# Patient Record
Sex: Male | Born: 1935
Health system: Southern US, Community
[De-identification: ages and names within clinical notes are randomized; demographics above are authoritative.]

## PROBLEM LIST (undated history)

## (undated) DIAGNOSIS — M1990 Unspecified osteoarthritis, unspecified site: Secondary | ICD-10-CM

## (undated) DIAGNOSIS — I2510 Atherosclerotic heart disease of native coronary artery without angina pectoris: Secondary | ICD-10-CM

## (undated) DIAGNOSIS — N189 Chronic kidney disease, unspecified: Secondary | ICD-10-CM

## (undated) DIAGNOSIS — I714 Abdominal aortic aneurysm, without rupture, unspecified: Secondary | ICD-10-CM

## (undated) DIAGNOSIS — H547 Unspecified visual loss: Secondary | ICD-10-CM

## (undated) DIAGNOSIS — N2581 Secondary hyperparathyroidism of renal origin: Secondary | ICD-10-CM

## (undated) DIAGNOSIS — R0600 Dyspnea, unspecified: Secondary | ICD-10-CM

## (undated) DIAGNOSIS — N2589 Other disorders resulting from impaired renal tubular function: Secondary | ICD-10-CM

## (undated) DIAGNOSIS — I10 Essential (primary) hypertension: Secondary | ICD-10-CM

## (undated) DIAGNOSIS — J189 Pneumonia, unspecified organism: Secondary | ICD-10-CM

## (undated) DIAGNOSIS — E7800 Pure hypercholesterolemia, unspecified: Secondary | ICD-10-CM

## (undated) DIAGNOSIS — N184 Chronic kidney disease, stage 4 (severe): Secondary | ICD-10-CM

## (undated) DIAGNOSIS — I129 Hypertensive chronic kidney disease with stage 1 through stage 4 chronic kidney disease, or unspecified chronic kidney disease: Secondary | ICD-10-CM

## (undated) DIAGNOSIS — J309 Allergic rhinitis, unspecified: Secondary | ICD-10-CM

## (undated) DIAGNOSIS — J449 Chronic obstructive pulmonary disease, unspecified: Secondary | ICD-10-CM

## (undated) HISTORY — DX: Pure hypercholesterolemia, unspecified: E78.00

## (undated) HISTORY — DX: Unspecified visual loss: H54.7

## (undated) HISTORY — DX: Unspecified osteoarthritis, unspecified site: M19.90

## (undated) HISTORY — DX: Chronic kidney disease, unspecified: N18.9

## (undated) HISTORY — PX: TONSILLECTOMY: SUR1361

## (undated) HISTORY — PX: APPENDECTOMY: SHX54

## (undated) HISTORY — DX: Other disorders resulting from impaired renal tubular function: N25.89

## (undated) HISTORY — DX: Hypertensive chronic kidney disease with stage 1 through stage 4 chronic kidney disease, or unspecified chronic kidney disease: I12.9

## (undated) HISTORY — DX: Secondary hyperparathyroidism of renal origin: N25.81

## (undated) HISTORY — DX: Chronic kidney disease, stage 4 (severe): N18.4

## (undated) HISTORY — DX: Chronic obstructive pulmonary disease, unspecified: J44.9

## (undated) HISTORY — PX: MULTIPLE TOOTH EXTRACTIONS: SHX2053

## (undated) HISTORY — DX: Pneumonia, unspecified organism: J18.9

## (undated) HISTORY — DX: Allergic rhinitis, unspecified: J30.9

## (undated) HISTORY — PX: HIP FRACTURE SURGERY: SHX118

## (undated) HISTORY — DX: Atherosclerotic heart disease of native coronary artery without angina pectoris: I25.10

---

## 2011-02-12 DIAGNOSIS — N184 Chronic kidney disease, stage 4 (severe): Secondary | ICD-10-CM | POA: Diagnosis not present

## 2011-04-19 DIAGNOSIS — M109 Gout, unspecified: Secondary | ICD-10-CM | POA: Diagnosis not present

## 2011-04-19 DIAGNOSIS — N184 Chronic kidney disease, stage 4 (severe): Secondary | ICD-10-CM | POA: Diagnosis not present

## 2011-04-19 DIAGNOSIS — E213 Hyperparathyroidism, unspecified: Secondary | ICD-10-CM | POA: Diagnosis not present

## 2011-04-19 DIAGNOSIS — I129 Hypertensive chronic kidney disease with stage 1 through stage 4 chronic kidney disease, or unspecified chronic kidney disease: Secondary | ICD-10-CM | POA: Diagnosis not present

## 2011-04-19 DIAGNOSIS — N2581 Secondary hyperparathyroidism of renal origin: Secondary | ICD-10-CM | POA: Diagnosis not present

## 2011-04-19 DIAGNOSIS — D649 Anemia, unspecified: Secondary | ICD-10-CM | POA: Diagnosis not present

## 2011-04-26 DIAGNOSIS — N184 Chronic kidney disease, stage 4 (severe): Secondary | ICD-10-CM | POA: Diagnosis not present

## 2011-05-22 DIAGNOSIS — N184 Chronic kidney disease, stage 4 (severe): Secondary | ICD-10-CM | POA: Diagnosis not present

## 2011-06-21 DIAGNOSIS — N184 Chronic kidney disease, stage 4 (severe): Secondary | ICD-10-CM | POA: Diagnosis not present

## 2011-07-23 DIAGNOSIS — M109 Gout, unspecified: Secondary | ICD-10-CM | POA: Diagnosis not present

## 2011-07-23 DIAGNOSIS — D649 Anemia, unspecified: Secondary | ICD-10-CM | POA: Diagnosis not present

## 2011-07-23 DIAGNOSIS — N2581 Secondary hyperparathyroidism of renal origin: Secondary | ICD-10-CM | POA: Diagnosis not present

## 2011-07-23 DIAGNOSIS — I129 Hypertensive chronic kidney disease with stage 1 through stage 4 chronic kidney disease, or unspecified chronic kidney disease: Secondary | ICD-10-CM | POA: Diagnosis not present

## 2011-07-23 DIAGNOSIS — N184 Chronic kidney disease, stage 4 (severe): Secondary | ICD-10-CM | POA: Diagnosis not present

## 2011-07-23 DIAGNOSIS — E213 Hyperparathyroidism, unspecified: Secondary | ICD-10-CM | POA: Diagnosis not present

## 2011-08-29 DIAGNOSIS — N184 Chronic kidney disease, stage 4 (severe): Secondary | ICD-10-CM | POA: Diagnosis not present

## 2011-12-28 DIAGNOSIS — I10 Essential (primary) hypertension: Secondary | ICD-10-CM | POA: Diagnosis not present

## 2011-12-28 DIAGNOSIS — E7800 Pure hypercholesterolemia, unspecified: Secondary | ICD-10-CM | POA: Diagnosis not present

## 2011-12-28 DIAGNOSIS — J209 Acute bronchitis, unspecified: Secondary | ICD-10-CM | POA: Diagnosis not present

## 2011-12-28 DIAGNOSIS — Z6827 Body mass index (BMI) 27.0-27.9, adult: Secondary | ICD-10-CM | POA: Diagnosis not present

## 2012-01-11 DIAGNOSIS — J209 Acute bronchitis, unspecified: Secondary | ICD-10-CM | POA: Diagnosis not present

## 2012-01-11 DIAGNOSIS — Z6826 Body mass index (BMI) 26.0-26.9, adult: Secondary | ICD-10-CM | POA: Diagnosis not present

## 2012-01-11 DIAGNOSIS — I10 Essential (primary) hypertension: Secondary | ICD-10-CM | POA: Diagnosis not present

## 2012-01-11 DIAGNOSIS — E7800 Pure hypercholesterolemia, unspecified: Secondary | ICD-10-CM | POA: Diagnosis not present

## 2012-01-14 DIAGNOSIS — I10 Essential (primary) hypertension: Secondary | ICD-10-CM | POA: Diagnosis not present

## 2012-01-14 DIAGNOSIS — Z6826 Body mass index (BMI) 26.0-26.9, adult: Secondary | ICD-10-CM | POA: Diagnosis not present

## 2012-01-14 DIAGNOSIS — L259 Unspecified contact dermatitis, unspecified cause: Secondary | ICD-10-CM | POA: Diagnosis not present

## 2012-01-14 DIAGNOSIS — E7800 Pure hypercholesterolemia, unspecified: Secondary | ICD-10-CM | POA: Diagnosis not present

## 2012-01-15 DIAGNOSIS — M109 Gout, unspecified: Secondary | ICD-10-CM | POA: Diagnosis not present

## 2012-01-15 DIAGNOSIS — N184 Chronic kidney disease, stage 4 (severe): Secondary | ICD-10-CM | POA: Diagnosis not present

## 2012-01-15 DIAGNOSIS — I129 Hypertensive chronic kidney disease with stage 1 through stage 4 chronic kidney disease, or unspecified chronic kidney disease: Secondary | ICD-10-CM | POA: Diagnosis not present

## 2012-01-15 DIAGNOSIS — I10 Essential (primary) hypertension: Secondary | ICD-10-CM | POA: Diagnosis not present

## 2012-01-22 DIAGNOSIS — Z6827 Body mass index (BMI) 27.0-27.9, adult: Secondary | ICD-10-CM | POA: Diagnosis not present

## 2012-01-22 DIAGNOSIS — I10 Essential (primary) hypertension: Secondary | ICD-10-CM | POA: Diagnosis not present

## 2012-01-22 DIAGNOSIS — E7800 Pure hypercholesterolemia, unspecified: Secondary | ICD-10-CM | POA: Diagnosis not present

## 2012-01-22 DIAGNOSIS — L259 Unspecified contact dermatitis, unspecified cause: Secondary | ICD-10-CM | POA: Diagnosis not present

## 2012-02-04 DIAGNOSIS — N184 Chronic kidney disease, stage 4 (severe): Secondary | ICD-10-CM | POA: Diagnosis not present

## 2012-02-18 DIAGNOSIS — N184 Chronic kidney disease, stage 4 (severe): Secondary | ICD-10-CM | POA: Diagnosis not present

## 2012-02-18 DIAGNOSIS — E213 Hyperparathyroidism, unspecified: Secondary | ICD-10-CM | POA: Diagnosis not present

## 2012-04-15 DIAGNOSIS — I10 Essential (primary) hypertension: Secondary | ICD-10-CM | POA: Diagnosis not present

## 2012-04-15 DIAGNOSIS — I129 Hypertensive chronic kidney disease with stage 1 through stage 4 chronic kidney disease, or unspecified chronic kidney disease: Secondary | ICD-10-CM | POA: Diagnosis not present

## 2012-04-15 DIAGNOSIS — M109 Gout, unspecified: Secondary | ICD-10-CM | POA: Diagnosis not present

## 2012-04-15 DIAGNOSIS — N184 Chronic kidney disease, stage 4 (severe): Secondary | ICD-10-CM | POA: Diagnosis not present

## 2012-05-02 DIAGNOSIS — N184 Chronic kidney disease, stage 4 (severe): Secondary | ICD-10-CM | POA: Diagnosis not present

## 2012-07-28 DIAGNOSIS — D649 Anemia, unspecified: Secondary | ICD-10-CM | POA: Diagnosis not present

## 2012-07-28 DIAGNOSIS — N184 Chronic kidney disease, stage 4 (severe): Secondary | ICD-10-CM | POA: Diagnosis not present

## 2012-07-28 DIAGNOSIS — I129 Hypertensive chronic kidney disease with stage 1 through stage 4 chronic kidney disease, or unspecified chronic kidney disease: Secondary | ICD-10-CM | POA: Diagnosis not present

## 2012-07-28 DIAGNOSIS — N2581 Secondary hyperparathyroidism of renal origin: Secondary | ICD-10-CM | POA: Diagnosis not present

## 2012-07-28 DIAGNOSIS — E213 Hyperparathyroidism, unspecified: Secondary | ICD-10-CM | POA: Diagnosis not present

## 2012-07-28 DIAGNOSIS — I10 Essential (primary) hypertension: Secondary | ICD-10-CM | POA: Diagnosis not present

## 2012-11-03 DIAGNOSIS — D509 Iron deficiency anemia, unspecified: Secondary | ICD-10-CM | POA: Diagnosis not present

## 2012-11-03 DIAGNOSIS — N184 Chronic kidney disease, stage 4 (severe): Secondary | ICD-10-CM | POA: Diagnosis not present

## 2012-11-03 DIAGNOSIS — N2581 Secondary hyperparathyroidism of renal origin: Secondary | ICD-10-CM | POA: Diagnosis not present

## 2012-11-03 DIAGNOSIS — I129 Hypertensive chronic kidney disease with stage 1 through stage 4 chronic kidney disease, or unspecified chronic kidney disease: Secondary | ICD-10-CM | POA: Diagnosis not present

## 2012-11-03 DIAGNOSIS — E213 Hyperparathyroidism, unspecified: Secondary | ICD-10-CM | POA: Diagnosis not present

## 2012-11-03 DIAGNOSIS — S1190XA Unspecified open wound of unspecified part of neck, initial encounter: Secondary | ICD-10-CM | POA: Diagnosis not present

## 2013-01-22 DIAGNOSIS — J301 Allergic rhinitis due to pollen: Secondary | ICD-10-CM | POA: Diagnosis not present

## 2013-02-17 DIAGNOSIS — I10 Essential (primary) hypertension: Secondary | ICD-10-CM | POA: Diagnosis not present

## 2013-02-17 DIAGNOSIS — N184 Chronic kidney disease, stage 4 (severe): Secondary | ICD-10-CM | POA: Diagnosis not present

## 2013-02-17 DIAGNOSIS — E213 Hyperparathyroidism, unspecified: Secondary | ICD-10-CM | POA: Diagnosis not present

## 2013-02-17 DIAGNOSIS — D649 Anemia, unspecified: Secondary | ICD-10-CM | POA: Diagnosis not present

## 2013-05-18 DIAGNOSIS — N039 Chronic nephritic syndrome with unspecified morphologic changes: Secondary | ICD-10-CM | POA: Diagnosis not present

## 2013-05-18 DIAGNOSIS — N184 Chronic kidney disease, stage 4 (severe): Secondary | ICD-10-CM | POA: Diagnosis not present

## 2013-05-18 DIAGNOSIS — M109 Gout, unspecified: Secondary | ICD-10-CM | POA: Diagnosis not present

## 2013-05-18 DIAGNOSIS — I129 Hypertensive chronic kidney disease with stage 1 through stage 4 chronic kidney disease, or unspecified chronic kidney disease: Secondary | ICD-10-CM | POA: Diagnosis not present

## 2013-05-18 DIAGNOSIS — D631 Anemia in chronic kidney disease: Secondary | ICD-10-CM | POA: Diagnosis not present

## 2013-05-18 DIAGNOSIS — N2581 Secondary hyperparathyroidism of renal origin: Secondary | ICD-10-CM | POA: Diagnosis not present

## 2013-10-19 DIAGNOSIS — D631 Anemia in chronic kidney disease: Secondary | ICD-10-CM | POA: Diagnosis not present

## 2013-10-19 DIAGNOSIS — N039 Chronic nephritic syndrome with unspecified morphologic changes: Secondary | ICD-10-CM | POA: Diagnosis not present

## 2013-10-19 DIAGNOSIS — I129 Hypertensive chronic kidney disease with stage 1 through stage 4 chronic kidney disease, or unspecified chronic kidney disease: Secondary | ICD-10-CM | POA: Diagnosis not present

## 2013-10-19 DIAGNOSIS — N2581 Secondary hyperparathyroidism of renal origin: Secondary | ICD-10-CM | POA: Diagnosis not present

## 2013-10-19 DIAGNOSIS — N184 Chronic kidney disease, stage 4 (severe): Secondary | ICD-10-CM | POA: Diagnosis not present

## 2014-02-10 DIAGNOSIS — E780 Pure hypercholesterolemia: Secondary | ICD-10-CM | POA: Diagnosis not present

## 2014-02-10 DIAGNOSIS — I10 Essential (primary) hypertension: Secondary | ICD-10-CM | POA: Diagnosis not present

## 2014-02-10 DIAGNOSIS — Z6827 Body mass index (BMI) 27.0-27.9, adult: Secondary | ICD-10-CM | POA: Diagnosis not present

## 2014-02-10 DIAGNOSIS — J301 Allergic rhinitis due to pollen: Secondary | ICD-10-CM | POA: Diagnosis not present

## 2014-02-10 DIAGNOSIS — J189 Pneumonia, unspecified organism: Secondary | ICD-10-CM | POA: Diagnosis not present

## 2014-03-10 DIAGNOSIS — N184 Chronic kidney disease, stage 4 (severe): Secondary | ICD-10-CM | POA: Diagnosis not present

## 2014-03-10 DIAGNOSIS — N2581 Secondary hyperparathyroidism of renal origin: Secondary | ICD-10-CM | POA: Diagnosis not present

## 2014-03-10 DIAGNOSIS — D631 Anemia in chronic kidney disease: Secondary | ICD-10-CM | POA: Diagnosis not present

## 2014-03-10 DIAGNOSIS — I129 Hypertensive chronic kidney disease with stage 1 through stage 4 chronic kidney disease, or unspecified chronic kidney disease: Secondary | ICD-10-CM | POA: Diagnosis not present

## 2014-07-19 DIAGNOSIS — N2581 Secondary hyperparathyroidism of renal origin: Secondary | ICD-10-CM | POA: Diagnosis not present

## 2014-07-19 DIAGNOSIS — M109 Gout, unspecified: Secondary | ICD-10-CM | POA: Diagnosis not present

## 2014-07-19 DIAGNOSIS — D631 Anemia in chronic kidney disease: Secondary | ICD-10-CM | POA: Diagnosis not present

## 2014-07-19 DIAGNOSIS — I129 Hypertensive chronic kidney disease with stage 1 through stage 4 chronic kidney disease, or unspecified chronic kidney disease: Secondary | ICD-10-CM | POA: Diagnosis not present

## 2014-07-19 DIAGNOSIS — N184 Chronic kidney disease, stage 4 (severe): Secondary | ICD-10-CM | POA: Diagnosis not present

## 2014-10-19 DIAGNOSIS — D631 Anemia in chronic kidney disease: Secondary | ICD-10-CM | POA: Diagnosis not present

## 2014-10-19 DIAGNOSIS — I129 Hypertensive chronic kidney disease with stage 1 through stage 4 chronic kidney disease, or unspecified chronic kidney disease: Secondary | ICD-10-CM | POA: Diagnosis not present

## 2014-10-19 DIAGNOSIS — N2581 Secondary hyperparathyroidism of renal origin: Secondary | ICD-10-CM | POA: Diagnosis not present

## 2014-10-19 DIAGNOSIS — N184 Chronic kidney disease, stage 4 (severe): Secondary | ICD-10-CM | POA: Diagnosis not present

## 2014-11-10 DIAGNOSIS — N184 Chronic kidney disease, stage 4 (severe): Secondary | ICD-10-CM | POA: Diagnosis not present

## 2015-03-28 DIAGNOSIS — M109 Gout, unspecified: Secondary | ICD-10-CM | POA: Diagnosis not present

## 2015-03-28 DIAGNOSIS — I129 Hypertensive chronic kidney disease with stage 1 through stage 4 chronic kidney disease, or unspecified chronic kidney disease: Secondary | ICD-10-CM | POA: Diagnosis not present

## 2015-03-28 DIAGNOSIS — N2589 Other disorders resulting from impaired renal tubular function: Secondary | ICD-10-CM | POA: Diagnosis not present

## 2015-03-28 DIAGNOSIS — M1990 Unspecified osteoarthritis, unspecified site: Secondary | ICD-10-CM | POA: Diagnosis not present

## 2015-03-28 DIAGNOSIS — N2581 Secondary hyperparathyroidism of renal origin: Secondary | ICD-10-CM | POA: Diagnosis not present

## 2015-03-28 DIAGNOSIS — M5030 Other cervical disc degeneration, unspecified cervical region: Secondary | ICD-10-CM | POA: Diagnosis not present

## 2015-03-28 DIAGNOSIS — D631 Anemia in chronic kidney disease: Secondary | ICD-10-CM | POA: Diagnosis not present

## 2015-03-28 DIAGNOSIS — N184 Chronic kidney disease, stage 4 (severe): Secondary | ICD-10-CM | POA: Diagnosis not present

## 2015-03-28 DIAGNOSIS — Z0000 Encounter for general adult medical examination without abnormal findings: Secondary | ICD-10-CM | POA: Diagnosis not present

## 2015-03-28 DIAGNOSIS — I2510 Atherosclerotic heart disease of native coronary artery without angina pectoris: Secondary | ICD-10-CM | POA: Diagnosis not present

## 2015-05-10 DIAGNOSIS — N184 Chronic kidney disease, stage 4 (severe): Secondary | ICD-10-CM | POA: Diagnosis not present

## 2015-05-24 ENCOUNTER — Other Ambulatory Visit: Payer: Self-pay | Admitting: *Deleted

## 2015-05-24 DIAGNOSIS — N184 Chronic kidney disease, stage 4 (severe): Secondary | ICD-10-CM

## 2015-05-24 DIAGNOSIS — Z01810 Encounter for preprocedural cardiovascular examination: Secondary | ICD-10-CM

## 2015-05-25 ENCOUNTER — Encounter: Payer: Self-pay | Admitting: Vascular Surgery

## 2015-06-03 ENCOUNTER — Encounter: Payer: Self-pay | Admitting: Vascular Surgery

## 2015-06-10 ENCOUNTER — Ambulatory Visit (INDEPENDENT_AMBULATORY_CARE_PROVIDER_SITE_OTHER): Payer: Medicare Other | Admitting: Vascular Surgery

## 2015-06-10 ENCOUNTER — Encounter: Payer: Self-pay | Admitting: Vascular Surgery

## 2015-06-10 ENCOUNTER — Ambulatory Visit (HOSPITAL_COMMUNITY)
Admission: RE | Admit: 2015-06-10 | Discharge: 2015-06-10 | Disposition: A | Discharge status: Home / Self Care | Payer: Medicare Other | Source: Ambulatory Visit | Attending: Vascular Surgery | Admitting: Vascular Surgery

## 2015-06-10 ENCOUNTER — Ambulatory Visit (INDEPENDENT_AMBULATORY_CARE_PROVIDER_SITE_OTHER)
Admission: RE | Admit: 2015-06-10 | Discharge: 2015-06-10 | Disposition: A | Discharge status: Home / Self Care | Payer: Medicare Other | Source: Ambulatory Visit | Attending: Vascular Surgery | Admitting: Vascular Surgery

## 2015-06-10 VITALS — BP 117/82 | HR 72 | Temp 97.5°F | Resp 18 | Ht 71.0 in | Wt 166.0 lb

## 2015-06-10 DIAGNOSIS — N184 Chronic kidney disease, stage 4 (severe): Secondary | ICD-10-CM | POA: Diagnosis not present

## 2015-06-10 DIAGNOSIS — I129 Hypertensive chronic kidney disease with stage 1 through stage 4 chronic kidney disease, or unspecified chronic kidney disease: Secondary | ICD-10-CM | POA: Diagnosis not present

## 2015-06-10 DIAGNOSIS — Z01810 Encounter for preprocedural cardiovascular examination: Secondary | ICD-10-CM | POA: Diagnosis not present

## 2015-06-10 NOTE — Progress Notes (Signed)
Referred by:  [name], MD [address] [name], [place] [address]  Reason for referral: New access  History of Present Illness  Kenneth Veal Sr. is a 81 y.o. (1934-10-16) male who presents for evaluation for permanent access.  The patient is right hand dominant but uses both hands due prior right shoulder injuries.  The patient has not had previous access procedures.  Previous central venous cannulation procedures include: none.  The patient has never had a PPM placed.   Past Medical History  Diagnosis Date  . Secondary hyperparathyroidism (HCC)   . DJD (degenerative joint disease)   . Chronic kidney disease   . Chronic kidney disease, stage IV (severe) (HCC)   . Arteriosclerotic cardiovascular disease   . Hypertensive renal disease   . Renal tubular acidosis     Past Surgical History  Procedure Laterality Date  . Appendectomy    . Tonsillectomy    . Hip fracture surgery Left     Social History   Social History  . Marital Status: Married    Spouse Name: N/A  . Number of Children: N/A  . Years of Education: N/A   Occupational History  . Not on file.   Social History Main Topics  . Smoking status: Former Games developer  . Smokeless tobacco: Current User    Types: Chew  . Alcohol Use: No  . Drug Use: No  . Sexual Activity: Not on file   Other Topics Concern  . Not on file   Social History Narrative    Family History  Problem Relation Age of Onset  . Diabetes Mother   . Stroke Father   . AAA (abdominal aortic aneurysm) Father     Current Outpatient Prescriptions  Medication Sig Dispense Refill  . allopurinol (ZYLOPRIM) 300 MG tablet Take 300 mg by mouth daily.    Marland Kitchen amLODipine-valsartan (EXFORGE) 5-320 MG tablet Take 1 tablet by mouth at bedtime.    Marland Kitchen aspirin EC 81 MG tablet Take 81 mg by mouth daily.    . B Complex-C-Folic Acid (RENA-VITE RX) 1 MG TABS Take 1 mg by mouth daily.    . calcitRIOL (ROCALTROL) 0.25 MCG capsule Take 0.25 mcg by  mouth. Per medication list from [facility] patient is to take 1 (one) every third day.    . calcium acetate (PHOSLO) 667 MG capsule Take 667 mg by mouth 2 (two) times daily with a meal.    . FLUTICASONE PROPIONATE, NASAL, NA Place 2 sprays into the nose daily.    . furosemide (LASIX) 80 MG tablet Take 80 mg by mouth daily. Take 1/2  Oral daily per office notes from [facility]    . loratadine (CLARITIN) 10 MG tablet Take 10 mg by mouth at bedtime.    . metoprolol succinate (TOPROL-XL) 50 MG 24 hr tablet Take 50 mg by mouth at bedtime. Take with or immediately following a meal.    . sodium bicarbonate 650 MG tablet Take 1,300 mg by mouth 2 (two) times daily.     No current facility-administered medications for this visit.    Allergies  Allergen Reactions  . Prednisone Rash    REVIEW OF SYSTEMS:  (Positives checked otherwise negative)  CARDIOVASCULAR:   [ ]  chest pain,  [ ]  chest pressure,  [ ]  palpitations,  [ ]  shortness of breath when laying flat,  [ ]  shortness of breath with exertion,   [ ]  pain in feet when walking,  [ ]  pain  in feet when laying flat, [ ]  history of blood clot in veins (DVT),  [ ]  history of phlebitis,  [x]  swelling in legs,  [ ]  varicose veins  PULMONARY:   [ ]  productive cough,  [ ]  asthma,  [ ]  wheezing  NEUROLOGIC:   [ ]  weakness in arms or legs,  [ ]  numbness in arms or legs,  [ ]  difficulty speaking or slurred speech,  [ ]  temporary loss of vision in one eye,  [ ]  dizziness  HEMATOLOGIC:   [ ]  bleeding problems,  [ ]  problems with blood clotting too easily  MUSCULOSKEL:   [ ]  joint pain, [ ]  joint swelling  GASTROINTEST:   [ ]  vomiting blood,  [ ]  blood in stool     GENITOURINARY:   [ ]  burning with urination,  [ ]  blood in urine  PSYCHIATRIC:   [ ]  history of major depression  INTEGUMENTARY:   [ ]  rashes,  [ ]  ulcers  CONSTITUTIONAL:   [ ]  fever,  [ ]  chills   Physical Examination  Filed Vitals:   06/10/15  1006  BP: 117/82  Pulse: 72  Temp: 97.5 F (36.4 C)  Resp: 18  Height: 5\' 11"  (1.803 m)  Weight: 166 lb (75.297 kg)  SpO2: 97%   Body mass index is 23.16 kg/(m^2).  General: A&O x 3, WDWN  Head: [place]/AT  Ear/Nose/Throat: Hearing grossly intact, nares w/o erythema or drainage, oropharynx w/o Erythema/Exudate, Mallampati score: 3  Eyes: PERRLA, EOMI  Neck: Supple, no nuchal rigidity, no palpable LAD  Pulmonary: Sym exp, good air movt, CTAB, no rales, rhonchi, & wheezing  Cardiac: RRR, Nl S1, S2, no Murmurs, rubs or gallops  Vascular: Vessel Right Left  Radial Palpable Palpable  Ulnar Palpable Palpable  Brachial Palpable Palpable  Carotid Palpable, without bruit Palpable, without bruit  Aorta Not palpable N/A  Femoral Palpable Palpable  Popliteal Not palpable Not palpable  PT Faintly Palpable Faintly Palpable  DP Palpable Palpable   Gastrointestinal: soft, NTND, -G/R, - HSM, - masses, - CVAT B  Musculoskeletal: M/S 5/5 throughout , Extremities without ischemic changes   Neurologic: CN 2-12 intact , Pain and light touch intact in extremities , Motor exam as listed above  Psychiatric: Judgment intact, Mood & affect appropriate for pt's clinical situation  Dermatologic: See M/S exam for extremity exam, no rashes otherwise noted  Lymph : No Cervical, Axillary, or Inguinal lymphadenopathy    Non-Invasive Vascular Imaging  Vein Mapping  (Date: 06/10/2015):   R arm: acceptable vein conduits include upper arm cephalic, marginal forearm cephalic  L arm: acceptable vein conduits include marginal upper arm cephalic, ok forearm cephalic  BUE Doppler (Date: 06/10/2015):   R arm:   Brachial: tri, 4.5 mm  Radial: tri, 1.8 mm  Ulnar: tri, 1.8 mm  L arm:   Brachial: tri, 4.6 mm  Radial: tri, 1.6 mm  Ulnar: tri, 1.8 mm   Outside Studies/Documentation 10 pages of outside documents were reviewed including: outpatient nephrology chart.   Medical Decision  Making  Kenneth FriendlyDavid Vassar Sr. is a 81 y.o. male who presents with chronic kidney disease stage IV    Based on vein mapping and examination, this patient's permanent access options include: R BC AVF, possible L BC AVF  I had an extensive discussion with this patient in regards to the nature of access surgery, including risk, benefits, and alternatives.    The patient is aware that the risks of access surgery include but are  not limited to: bleeding, infection, steal syndrome, nerve damage, ischemic monomelic neuropathy, failure of access to mature, and possible need for additional access procedures in the future.  The patient has NOT agreed to proceed with the above procedure.  He is going to discuss his options with his nephrology.     [name], MD [facility] Office: [phone] Pager: [phone]  06/10/2015, 12:18 PM

## 2015-06-17 ENCOUNTER — Other Ambulatory Visit: Payer: Self-pay

## 2015-06-27 ENCOUNTER — Encounter (HOSPITAL_COMMUNITY): Payer: Self-pay | Admitting: *Deleted

## 2015-06-27 MED ORDER — DEXTROSE 5 % IV SOLN
1.5000 g | INTRAVENOUS | Status: AC
  Administered 2015-06-28: 1.5 g via INTRAVENOUS
  Filled 2015-06-27: qty 1.5

## 2015-06-27 MED ORDER — SODIUM CHLORIDE 0.9 % IV SOLN
INTRAVENOUS | Status: DC
  Administered 2015-06-28: 07:00:00 via INTRAVENOUS

## 2015-06-27 NOTE — Anesthesia Preprocedure Evaluation (Addendum)
Anesthesia Evaluation  Patient identified by MRN, date of birth, ID band Patient awake    Reviewed: Allergy & Precautions, NPO status , Patient's Chart, lab work & pertinent test results, reviewed documented beta blocker date and time   Airway Mallampati: III  TM Distance: >3 FB Neck ROM: Full    Dental  (+) Dental Advisory Given, Lower Dentures, Upper Dentures   Pulmonary former smoker,    Pulmonary exam normal breath sounds clear to auscultation       Cardiovascular hypertension, Pt. on medications and Pt. on home beta blockers (-) angina(-) Past MI Normal cardiovascular exam Rhythm:Regular Rate:Normal     Neuro/Psych negative neurological ROS  negative psych ROS   GI/Hepatic negative GI ROS, Neg liver ROS,   Endo/Other  Secondary hyperparathyroidism   Renal/GU Renal Insufficiency and Renal hypertensionRenal disease (Chronic kidney disease, stage IV (severe) )     Musculoskeletal  (+) Arthritis  (s/p right shoulder surgery), Osteoarthritis,    Abdominal   Peds  Hematology negative hematology ROS (+)   Anesthesia Other Findings Day of surgery medications reviewed with the patient.  Reproductive/Obstetrics                          Anesthesia Physical Anesthesia Plan  ASA: III  Anesthesia Plan: MAC   Post-op Pain Management:    Induction: Intravenous  Airway Management Planned: Nasal Cannula  Additional Equipment:   Intra-op Plan:   Post-operative Plan:   Informed Consent: I have reviewed the patients History and Physical, chart, labs and discussed the procedure including the risks, benefits and alternatives for the proposed anesthesia with the patient or authorized representative who has indicated his/her understanding and acceptance.   Dental advisory given  Plan Discussed with: CRNA and Anesthesiologist  Anesthesia Plan Comments: (Discussed risks/benefits/alternatives to MAC  sedation including need for ventilatory support, hypotension, need for conversion to general anesthesia.  All patient questions answered.  Patient/guardian wishes to proceed.)        Anesthesia Quick Evaluation

## 2015-06-27 NOTE — Progress Notes (Signed)
Pt denies SOB, chest pain, and being under the care of a cardiologist. Pt denies having a stress test, echo and cardiac cath. Pt denies having an EKG and chest x ray within the last year. Pt made aware to stop otc vitamins, fish oil, herbal medications and NSAID's. Pt verbalized understanding of all pre-op instructions.

## 2015-06-28 ENCOUNTER — Encounter (HOSPITAL_COMMUNITY)
Admission: RE | Disposition: A | Discharge status: Home / Self Care | Payer: Self-pay | Source: Ambulatory Visit | Attending: Vascular Surgery

## 2015-06-28 ENCOUNTER — Ambulatory Visit (HOSPITAL_COMMUNITY)
Admission: RE | Admit: 2015-06-28 | Discharge: 2015-06-28 | Disposition: A | Discharge status: Home / Self Care | Payer: Medicare Other | Source: Ambulatory Visit | Attending: Vascular Surgery | Admitting: Vascular Surgery

## 2015-06-28 ENCOUNTER — Ambulatory Visit (HOSPITAL_COMMUNITY): Payer: Medicare Other | Admitting: Vascular Surgery

## 2015-06-28 ENCOUNTER — Encounter (HOSPITAL_COMMUNITY): Payer: Self-pay | Admitting: Certified Registered Nurse Anesthetist

## 2015-06-28 DIAGNOSIS — I129 Hypertensive chronic kidney disease with stage 1 through stage 4 chronic kidney disease, or unspecified chronic kidney disease: Secondary | ICD-10-CM | POA: Insufficient documentation

## 2015-06-28 DIAGNOSIS — Z87891 Personal history of nicotine dependence: Secondary | ICD-10-CM | POA: Diagnosis not present

## 2015-06-28 DIAGNOSIS — N184 Chronic kidney disease, stage 4 (severe): Secondary | ICD-10-CM | POA: Diagnosis not present

## 2015-06-28 DIAGNOSIS — N2589 Other disorders resulting from impaired renal tubular function: Secondary | ICD-10-CM | POA: Diagnosis not present

## 2015-06-28 DIAGNOSIS — N2581 Secondary hyperparathyroidism of renal origin: Secondary | ICD-10-CM | POA: Insufficient documentation

## 2015-06-28 DIAGNOSIS — Z79899 Other long term (current) drug therapy: Secondary | ICD-10-CM | POA: Insufficient documentation

## 2015-06-28 DIAGNOSIS — Z7982 Long term (current) use of aspirin: Secondary | ICD-10-CM | POA: Diagnosis not present

## 2015-06-28 HISTORY — PX: AV FISTULA PLACEMENT: SHX1204

## 2015-06-28 HISTORY — DX: Essential (primary) hypertension: I10

## 2015-06-28 LAB — POCT I-STAT 4, (NA,K, GLUC, HGB,HCT)
Glucose, Bld: 95 mg/dL (ref 65–99)
HCT: 53 % — ABNORMAL HIGH (ref 39.0–52.0)
Hemoglobin: 18 g/dL — ABNORMAL HIGH (ref 13.0–17.0)
Potassium: 4 mmol/L (ref 3.5–5.1)
Sodium: 141 mmol/L (ref 135–145)

## 2015-06-28 SURGERY — ARTERIOVENOUS (AV) FISTULA CREATION
Anesthesia: Monitor Anesthesia Care | Site: Arm Lower | Laterality: Right

## 2015-06-28 MED ORDER — PROPOFOL 500 MG/50ML IV EMUL
INTRAVENOUS | Status: DC | PRN
  Administered 2015-06-28: 60 ug/kg/min via INTRAVENOUS

## 2015-06-28 MED ORDER — PHENYLEPHRINE HCL 10 MG/ML IJ SOLN
INTRAMUSCULAR | Status: DC | PRN
  Administered 2015-06-28: 120 ug via INTRAVENOUS
  Administered 2015-06-28: 160 ug via INTRAVENOUS
  Administered 2015-06-28 (×2): 120 ug via INTRAVENOUS

## 2015-06-28 MED ORDER — FENTANYL CITRATE (PF) 100 MCG/2ML IJ SOLN: 25.0000 ug | INTRAMUSCULAR | Status: DC | PRN

## 2015-06-28 MED ORDER — ONDANSETRON HCL 4 MG/2ML IJ SOLN: 4.0000 mg | Freq: Once | INTRAMUSCULAR | Status: DC | PRN

## 2015-06-28 MED ORDER — FENTANYL CITRATE (PF) 250 MCG/5ML IJ SOLN
INTRAMUSCULAR | Status: AC
  Filled 2015-06-28: qty 5

## 2015-06-28 MED ORDER — CHLORHEXIDINE GLUCONATE CLOTH 2 % EX PADS: 6.0000 | MEDICATED_PAD | Freq: Once | CUTANEOUS | Status: DC

## 2015-06-28 MED ORDER — PHENYLEPHRINE HCL 10 MG/ML IJ SOLN
10.0000 mg | INTRAVENOUS | Status: DC | PRN
  Administered 2015-06-28: 30 ug/min via INTRAVENOUS

## 2015-06-28 MED ORDER — MIDAZOLAM HCL 2 MG/2ML IJ SOLN
INTRAMUSCULAR | Status: AC
  Filled 2015-06-28: qty 2

## 2015-06-28 MED ORDER — EPHEDRINE SULFATE 50 MG/ML IJ SOLN
INTRAMUSCULAR | Status: DC | PRN
  Administered 2015-06-28: 5 mg via INTRAVENOUS

## 2015-06-28 MED ORDER — FENTANYL CITRATE (PF) 100 MCG/2ML IJ SOLN
INTRAMUSCULAR | Status: DC | PRN
  Administered 2015-06-28: 50 ug via INTRAVENOUS

## 2015-06-28 MED ORDER — ALBUMIN HUMAN 5 % IV SOLN
INTRAVENOUS | Status: DC | PRN
  Administered 2015-06-28: 08:00:00 via INTRAVENOUS

## 2015-06-28 MED ORDER — MIDAZOLAM HCL 5 MG/5ML IJ SOLN
INTRAMUSCULAR | Status: DC | PRN
  Administered 2015-06-28: 0.5 mg via INTRAVENOUS

## 2015-06-28 MED ORDER — SODIUM CHLORIDE 0.9 % IV SOLN
INTRAVENOUS | Status: DC | PRN
  Administered 2015-06-28: 500 mL

## 2015-06-28 MED ORDER — PROPOFOL 10 MG/ML IV BOLUS
INTRAVENOUS | Status: AC
  Filled 2015-06-28: qty 20

## 2015-06-28 MED ORDER — PHENYLEPHRINE 40 MCG/ML (10ML) SYRINGE FOR IV PUSH (FOR BLOOD PRESSURE SUPPORT)
PREFILLED_SYRINGE | INTRAVENOUS | Status: AC
  Filled 2015-06-28: qty 20

## 2015-06-28 MED ORDER — LIDOCAINE HCL (PF) 1 % IJ SOLN
INTRAMUSCULAR | Status: DC | PRN
  Administered 2015-06-28: 2 mL

## 2015-06-28 MED ORDER — EPHEDRINE 5 MG/ML INJ
INTRAVENOUS | Status: AC
  Filled 2015-06-28: qty 10

## 2015-06-28 MED ORDER — OXYCODONE-ACETAMINOPHEN 5-325 MG PO TABS: 1.0000 | ORAL_TABLET | Freq: Four times a day (QID) | ORAL | Status: DC | PRN

## 2015-06-28 MED ORDER — 0.9 % SODIUM CHLORIDE (POUR BTL) OPTIME
TOPICAL | Status: DC | PRN
  Administered 2015-06-28: 1000 mL

## 2015-06-28 MED ORDER — LIDOCAINE HCL (PF) 1 % IJ SOLN
INTRAMUSCULAR | Status: AC
  Filled 2015-06-28: qty 30

## 2015-06-28 SURGICAL SUPPLY — 34 items
ARMBAND PINK RESTRICT EXTREMIT (MISCELLANEOUS) ×3 IMPLANT
CANISTER SUCTION 2500CC (MISCELLANEOUS) ×3 IMPLANT
CLIP TI MEDIUM 6 (CLIP) ×3 IMPLANT
CLIP TI WIDE RED SMALL 6 (CLIP) ×3 IMPLANT
COVER PROBE W GEL 5X96 (DRAPES) ×3 IMPLANT
DECANTER SPIKE VIAL GLASS SM (MISCELLANEOUS) ×3 IMPLANT
ELECT REM PT RETURN 9FT ADLT (ELECTROSURGICAL) ×3
ELECTRODE REM PT RTRN 9FT ADLT (ELECTROSURGICAL) ×1 IMPLANT
GLOVE BIO SURGEON STRL SZ7 (GLOVE) ×3 IMPLANT
GLOVE BIOGEL PI IND STRL 6.5 (GLOVE) ×3 IMPLANT
GLOVE BIOGEL PI IND STRL 7.0 (GLOVE) ×1 IMPLANT
GLOVE BIOGEL PI IND STRL 7.5 (GLOVE) ×2 IMPLANT
GLOVE BIOGEL PI INDICATOR 6.5 (GLOVE) ×6
GLOVE BIOGEL PI INDICATOR 7.0 (GLOVE) ×2
GLOVE BIOGEL PI INDICATOR 7.5 (GLOVE) ×4
GLOVE ECLIPSE 6.5 STRL STRAW (GLOVE) ×3 IMPLANT
GLOVE SURG SS PI 6.5 STRL IVOR (GLOVE) ×3 IMPLANT
GLOVE SURG SS PI 7.0 STRL IVOR (GLOVE) ×3 IMPLANT
GOWN STRL REUS W/ TWL LRG LVL3 (GOWN DISPOSABLE) ×4 IMPLANT
GOWN STRL REUS W/TWL LRG LVL3 (GOWN DISPOSABLE) ×8
HEMOSTAT SPONGE AVITENE ULTRA (HEMOSTASIS) IMPLANT
KIT BASIN OR (CUSTOM PROCEDURE TRAY) ×3 IMPLANT
KIT ROOM TURNOVER OR (KITS) ×3 IMPLANT
LIQUID BAND (GAUZE/BANDAGES/DRESSINGS) ×3 IMPLANT
NS IRRIG 1000ML POUR BTL (IV SOLUTION) ×3 IMPLANT
PACK CV ACCESS (CUSTOM PROCEDURE TRAY) ×3 IMPLANT
PAD ARMBOARD 7.5X6 YLW CONV (MISCELLANEOUS) ×6 IMPLANT
SUT MNCRL AB 4-0 PS2 18 (SUTURE) ×3 IMPLANT
SUT PROLENE 6 0 BV (SUTURE) ×6 IMPLANT
SUT PROLENE 7 0 BV 1 (SUTURE) ×6 IMPLANT
SUT VIC AB 3-0 SH 27 (SUTURE) ×2
SUT VIC AB 3-0 SH 27X BRD (SUTURE) ×1 IMPLANT
UNDERPAD 30X30 INCONTINENT (UNDERPADS AND DIAPERS) ×3 IMPLANT
WATER STERILE IRR 1000ML POUR (IV SOLUTION) ×3 IMPLANT

## 2015-06-28 NOTE — Op Note (Signed)
OPERATIVE NOTE   PROCEDURE: right brachiocephalic arteriovenous fistula placement  PRE-OPERATIVE DIAGNOSIS: chronic kidney disease stage IV   POST-OPERATIVE DIAGNOSIS: same as above   SURGEON: Kenneth SakeBrian Mellissa Conley, MD  ASSISTANT(S): RNFA  ANESTHESIA: local and MAC  ESTIMATED BLOOD LOSS: 50 cc  FINDING(S): 1.  Palpable thrill at end of case 2.  Dopplerable right radial signal   SPECIMEN(S):  none  INDICATIONS:   Kenneth PyoDavid G Maggi Sr. is a 81 y.o. male who presents with chronic kidney disease stage IV.  The patient is scheduled for right brachiocephalic arteriovenous fistula placement.  The patient is aware the risks include but are not limited to: bleeding, infection, steal syndrome, nerve damage, ischemic monomelic neuropathy, failure to mature, and need for additional procedures.  The patient is aware of the risks of the procedure and elects to proceed forward.  DESCRIPTION: After full informed written consent was obtained from the patient, the patient was brought back to the operating room and placed supine upon the operating table.  Prior to induction, the patient received IV antibiotics.   After obtaining adequate anesthesia, the patient was then prepped and draped in the standard fashion for a right arm access procedure.  I turned my attention first to identifying the patient's cephalic vein and brachial artery.  Using SonoSite guidance, the location of these vessels were marked out on the skin.   At this point, I injected local anesthetic to obtain a field block of the antecubitum.  In total, I injected about 2 mL of 1% lidocaine without epinephrine.  I made a transverse incision at the level of the antecubitum and dissected through the subcutaneous tissue and fascia to gain exposure of the brachial artery.  This was noted to be 2.5-3.0 mm in diameter externally.  This was dissected out proximally and distally and controlled with vessel loops .  I then dissected out the cephalic vein.   This was noted to be 3 mm in diameter externally.  The distal segment of the vein was ligated with a  2-0 silk, and the vein was transected.  The proximal segment was interrogated with serial dilators.  The vein accepted up to a 3.5 mm dilator without any difficulty.  I then instilled the heparinized saline into the vein and clamped it.  At this point, I reset my exposure of the brachial artery and placed the artery under tension proximally and distally.  I made an arteriotomy with a #11 blade, and then I extended the arteriotomy with a Potts scissor.  I injected heparinized saline proximal and distal to this arteriotomy.  The vein was then sewn to the artery in an end-to-side configuration with a running stitch of 7-0 Prolene.  Prior to completing this anastomosis, I allowed the vein and artery to backbleed.  There was no evidence of clot from any vessels.  I completed the anastomosis in the usual fashion and then released all vessel loops and clamps.  There was a palpable thrill in the venous outflow, and there was a dopplerable radial signal.  At this point, I irrigated out the surgical wound.  There was no further active bleeding.  The subcutaneous tissue was reapproximated with a running stitch of 3-0 Vicryl.  The skin was then reapproximated with a running subcuticular stitch of 4-0 Vicryl.  The skin was then cleaned, dried, and reinforced with Dermabond.  The patient tolerated this procedure well.    COMPLICATIONS: none  CONDITION: stable   Kenneth SakeBrian Jadarian Mckay, MD [facility] Office: [phone] Pager: [phone]  06/28/2015, 8:36 AM

## 2015-06-28 NOTE — H&P (View-Only) (Signed)
Referred by:  [name], MD [address] [name], [place] [address]  Reason for referral: New access  History of Present Illness  Kenneth Veal Sr. is a 81 y.o. (1934-10-16) male who presents for evaluation for permanent access.  The patient is right hand dominant but uses both hands due prior right shoulder injuries.  The patient has not had previous access procedures.  Previous central venous cannulation procedures include: none.  The patient has never had a PPM placed.   Past Medical History  Diagnosis Date  . Secondary hyperparathyroidism (HCC)   . DJD (degenerative joint disease)   . Chronic kidney disease   . Chronic kidney disease, stage IV (severe) (HCC)   . Arteriosclerotic cardiovascular disease   . Hypertensive renal disease   . Renal tubular acidosis     Past Surgical History  Procedure Laterality Date  . Appendectomy    . Tonsillectomy    . Hip fracture surgery Left     Social History   Social History  . Marital Status: Married    Spouse Name: N/A  . Number of Children: N/A  . Years of Education: N/A   Occupational History  . Not on file.   Social History Main Topics  . Smoking status: Former Games developer  . Smokeless tobacco: Current User    Types: Chew  . Alcohol Use: No  . Drug Use: No  . Sexual Activity: Not on file   Other Topics Concern  . Not on file   Social History Narrative    Family History  Problem Relation Age of Onset  . Diabetes Mother   . Stroke Father   . AAA (abdominal aortic aneurysm) Father     Current Outpatient Prescriptions  Medication Sig Dispense Refill  . allopurinol (ZYLOPRIM) 300 MG tablet Take 300 mg by mouth daily.    Marland Kitchen amLODipine-valsartan (EXFORGE) 5-320 MG tablet Take 1 tablet by mouth at bedtime.    Marland Kitchen aspirin EC 81 MG tablet Take 81 mg by mouth daily.    . B Complex-C-Folic Acid (RENA-VITE RX) 1 MG TABS Take 1 mg by mouth daily.    . calcitRIOL (ROCALTROL) 0.25 MCG capsule Take 0.25 mcg by  mouth. Per medication list from [facility] patient is to take 1 (one) every third day.    . calcium acetate (PHOSLO) 667 MG capsule Take 667 mg by mouth 2 (two) times daily with a meal.    . FLUTICASONE PROPIONATE, NASAL, NA Place 2 sprays into the nose daily.    . furosemide (LASIX) 80 MG tablet Take 80 mg by mouth daily. Take 1/2  Oral daily per office notes from [facility]    . loratadine (CLARITIN) 10 MG tablet Take 10 mg by mouth at bedtime.    . metoprolol succinate (TOPROL-XL) 50 MG 24 hr tablet Take 50 mg by mouth at bedtime. Take with or immediately following a meal.    . sodium bicarbonate 650 MG tablet Take 1,300 mg by mouth 2 (two) times daily.     No current facility-administered medications for this visit.    Allergies  Allergen Reactions  . Prednisone Rash    REVIEW OF SYSTEMS:  (Positives checked otherwise negative)  CARDIOVASCULAR:   [ ]  chest pain,  [ ]  chest pressure,  [ ]  palpitations,  [ ]  shortness of breath when laying flat,  [ ]  shortness of breath with exertion,   [ ]  pain in feet when walking,  [ ]  pain  in feet when laying flat, [ ]  history of blood clot in veins (DVT),  [ ]  history of phlebitis,  [x]  swelling in legs,  [ ]  varicose veins  PULMONARY:   [ ]  productive cough,  [ ]  asthma,  [ ]  wheezing  NEUROLOGIC:   [ ]  weakness in arms or legs,  [ ]  numbness in arms or legs,  [ ]  difficulty speaking or slurred speech,  [ ]  temporary loss of vision in one eye,  [ ]  dizziness  HEMATOLOGIC:   [ ]  bleeding problems,  [ ]  problems with blood clotting too easily  MUSCULOSKEL:   [ ]  joint pain, [ ]  joint swelling  GASTROINTEST:   [ ]  vomiting blood,  [ ]  blood in stool     GENITOURINARY:   [ ]  burning with urination,  [ ]  blood in urine  PSYCHIATRIC:   [ ]  history of major depression  INTEGUMENTARY:   [ ]  rashes,  [ ]  ulcers  CONSTITUTIONAL:   [ ]  fever,  [ ]  chills   Physical Examination  Filed Vitals:   06/10/15  1006  BP: 117/82  Pulse: 72  Temp: 97.5 F (36.4 C)  Resp: 18  Height: 5\' 11"  (1.803 m)  Weight: 166 lb (75.297 kg)  SpO2: 97%   Body mass index is 23.16 kg/(m^2).  General: A&O x 3, WDWN  Head: [place]/AT  Ear/Nose/Throat: Hearing grossly intact, nares w/o erythema or drainage, oropharynx w/o Erythema/Exudate, Mallampati score: 3  Eyes: PERRLA, EOMI  Neck: Supple, no nuchal rigidity, no palpable LAD  Pulmonary: Sym exp, good air movt, CTAB, no rales, rhonchi, & wheezing  Cardiac: RRR, Nl S1, S2, no Murmurs, rubs or gallops  Vascular: Vessel Right Left  Radial Palpable Palpable  Ulnar Palpable Palpable  Brachial Palpable Palpable  Carotid Palpable, without bruit Palpable, without bruit  Aorta Not palpable N/A  Femoral Palpable Palpable  Popliteal Not palpable Not palpable  PT Faintly Palpable Faintly Palpable  DP Palpable Palpable   Gastrointestinal: soft, NTND, -G/R, - HSM, - masses, - CVAT B  Musculoskeletal: M/S 5/5 throughout , Extremities without ischemic changes   Neurologic: CN 2-12 intact , Pain and light touch intact in extremities , Motor exam as listed above  Psychiatric: Judgment intact, Mood & affect appropriate for pt's clinical situation  Dermatologic: See M/S exam for extremity exam, no rashes otherwise noted  Lymph : No Cervical, Axillary, or Inguinal lymphadenopathy    Non-Invasive Vascular Imaging  Vein Mapping  (Date: 06/10/2015):   R arm: acceptable vein conduits include upper arm cephalic, marginal forearm cephalic  L arm: acceptable vein conduits include marginal upper arm cephalic, ok forearm cephalic  BUE Doppler (Date: 06/10/2015):   R arm:   Brachial: tri, 4.5 mm  Radial: tri, 1.8 mm  Ulnar: tri, 1.8 mm  L arm:   Brachial: tri, 4.6 mm  Radial: tri, 1.6 mm  Ulnar: tri, 1.8 mm   Outside Studies/Documentation 10 pages of outside documents were reviewed including: outpatient nephrology chart.   Medical Decision  Making  Kenneth FriendlyDavid Vassar Sr. is a 81 y.o. male who presents with chronic kidney disease stage IV    Based on vein mapping and examination, this patient's permanent access options include: R BC AVF, possible L BC AVF  I had an extensive discussion with this patient in regards to the nature of access surgery, including risk, benefits, and alternatives.    The patient is aware that the risks of access surgery include but are  not limited to: bleeding, infection, steal syndrome, nerve damage, ischemic monomelic neuropathy, failure of access to mature, and possible need for additional access procedures in the future.  The patient has NOT agreed to proceed with the above procedure.  He is going to discuss his options with his nephrology.     [name], MD [facility] Office: [phone] Pager: [phone]  06/10/2015, 12:18 PM

## 2015-06-28 NOTE — Interval H&P Note (Signed)
History and Physical Interval Note:  06/28/2015 7:07 AM  Kenneth Pyoavid G Kaser Sr.  has presented today for surgery, with the diagnosis of Stage IV Chronic Kidney Disease N18.4  The various methods of treatment have been discussed with the patient and family. After consideration of risks, benefits and other options for treatment, the patient has consented to  Procedure(s): BRACHIOCEPHALIC ARTERIOVENOUS (AV) FISTULA CREATION (Right) as a surgical intervention .  The patient's history has been reviewed, patient examined, no change in status, stable for surgery.  I have reviewed the patient's chart and labs.  Questions were answered to the patient's satisfaction.     Leonides Sakehen, Riyansh Gerstner

## 2015-06-28 NOTE — Transfer of Care (Signed)
Immediate Anesthesia Transfer of Care Note  Patient: Kenneth PyoDavid G Lennartz Sr.  Procedure(s) Performed: Procedure(s): RIGHT BRACHIOCEPHALIC ARTERIOVENOUS (AV) FISTULA CREATION (Right)  Patient Location: PACU  Anesthesia Type:MAC  Level of Consciousness: awake, alert , oriented and patient cooperative  Airway & Oxygen Therapy: Patient Spontanous Breathing and Patient connected to face mask oxygen  Post-op Assessment: Report given to RN and Post -op Vital signs reviewed and stable  Post vital signs: Reviewed and stable  Last Vitals:  Filed Vitals:   06/28/15 0614  BP: 120/79  Pulse: 63  Temp: 36.5 C  Resp: 18    Last Pain: There were no vitals filed for this visit.    Patients Stated Pain Goal: 5 (06/28/15 [id number])  Complications: No apparent anesthesia complications

## 2015-06-29 ENCOUNTER — Telehealth: Payer: Self-pay | Admitting: Vascular Surgery

## 2015-06-29 ENCOUNTER — Encounter (HOSPITAL_COMMUNITY): Payer: Self-pay | Admitting: Vascular Surgery

## 2015-06-29 NOTE — Telephone Encounter (Signed)
Sched appt 7/14 at 8:30. Spoke to pt to inform them of appt.

## 2015-06-29 NOTE — Anesthesia Postprocedure Evaluation (Signed)
Anesthesia Post Note  Patient: Allene PyoDavid G Livolsi Sr.  Procedure(s) Performed: Procedure(s) (LRB): RIGHT BRACHIOCEPHALIC ARTERIOVENOUS (AV) FISTULA CREATION (Right)  Patient location during evaluation: PACU Anesthesia Type: MAC Level of consciousness: awake and alert, oriented, awake and patient cooperative Pain management: pain level controlled Vital Signs Assessment: post-procedure vital signs reviewed and stable Respiratory status: spontaneous breathing, nonlabored ventilation, respiratory function stable and patient connected to nasal cannula oxygen Cardiovascular status: blood pressure returned to baseline and stable Postop Assessment: no signs of nausea or vomiting Anesthetic complications: no    Last Vitals:  Filed Vitals:   06/28/15 0915 06/28/15 0933  BP: 103/74 119/71  Pulse: 77 63  Temp: 36.1 C   Resp: 14     Last Pain:  Filed Vitals:   06/28/15 0938  PainSc: 0-No pain                 Cecile HearingStephen Edward Turk

## 2015-06-29 NOTE — Telephone Encounter (Signed)
-----   Message from Sharee PimpleMarilyn K McChesney, RN sent at 06/28/2015 10:42 AM EDT ----- Regarding: schedule   ----- Message -----    From: Fransisco HertzBrian L Chen, MD    Sent: 06/28/2015   8:39 AM      [address] [address]  Allene PyoDavid G Mangiaracina Sr. [id number] 12/16/1934   PROCEDURE: right brachiocephalic arteriovenous fistula placement  Follow-up: 6 weeks

## 2015-07-01 ENCOUNTER — Telehealth: Payer: Self-pay

## 2015-07-01 NOTE — Telephone Encounter (Signed)
rec'd phone call from pt.  Reported he has "some redness from the bend of elbow down into the forearm, approx. 1/2 way to the wrist."  Stated there is "slight swelling."  Reported "the redness comes and goes."  Stated the incision is intact, without any redness or swelling.  Reported very small amt. of bloody drainage from one end of incision, yesterday, that "has dried up." Denied any further drainage. Denied fever/ chills.  Reported "some stinging in the incision."  Advised to continue to monitor the arm for signs of infection.  Encouraged to shower daily and wash incision with antibacterial soap.  Also encouraged to elevate the right arm above level of heart at intervals to keep swelling down.  Encouraged to call office if any concerns.  Verb. Understanding.

## 2015-07-25 DIAGNOSIS — N2589 Other disorders resulting from impaired renal tubular function: Secondary | ICD-10-CM | POA: Diagnosis not present

## 2015-07-25 DIAGNOSIS — I2510 Atherosclerotic heart disease of native coronary artery without angina pectoris: Secondary | ICD-10-CM | POA: Diagnosis not present

## 2015-07-25 DIAGNOSIS — I129 Hypertensive chronic kidney disease with stage 1 through stage 4 chronic kidney disease, or unspecified chronic kidney disease: Secondary | ICD-10-CM | POA: Diagnosis not present

## 2015-07-25 DIAGNOSIS — N184 Chronic kidney disease, stage 4 (severe): Secondary | ICD-10-CM | POA: Diagnosis not present

## 2015-07-25 DIAGNOSIS — E669 Obesity, unspecified: Secondary | ICD-10-CM | POA: Diagnosis not present

## 2015-07-25 DIAGNOSIS — M1990 Unspecified osteoarthritis, unspecified site: Secondary | ICD-10-CM | POA: Diagnosis not present

## 2015-07-25 DIAGNOSIS — I770 Arteriovenous fistula, acquired: Secondary | ICD-10-CM | POA: Diagnosis not present

## 2015-07-25 DIAGNOSIS — D631 Anemia in chronic kidney disease: Secondary | ICD-10-CM | POA: Diagnosis not present

## 2015-07-25 DIAGNOSIS — M109 Gout, unspecified: Secondary | ICD-10-CM | POA: Diagnosis not present

## 2015-07-25 DIAGNOSIS — J302 Other seasonal allergic rhinitis: Secondary | ICD-10-CM | POA: Diagnosis not present

## 2015-07-25 DIAGNOSIS — N2581 Secondary hyperparathyroidism of renal origin: Secondary | ICD-10-CM | POA: Diagnosis not present

## 2015-07-25 DIAGNOSIS — M5030 Other cervical disc degeneration, unspecified cervical region: Secondary | ICD-10-CM | POA: Diagnosis not present

## 2015-08-16 ENCOUNTER — Encounter: Payer: Self-pay | Admitting: Vascular Surgery

## 2015-08-19 ENCOUNTER — Encounter: Payer: Self-pay | Admitting: Vascular Surgery

## 2015-08-19 ENCOUNTER — Ambulatory Visit (INDEPENDENT_AMBULATORY_CARE_PROVIDER_SITE_OTHER): Payer: Medicare Other | Admitting: Vascular Surgery

## 2015-08-19 VITALS — BP 112/70 | HR 84 | Temp 98.5°F | Ht 71.0 in | Wt 170.1 lb

## 2015-08-19 DIAGNOSIS — N184 Chronic kidney disease, stage 4 (severe): Secondary | ICD-10-CM

## 2015-08-19 NOTE — Progress Notes (Signed)
    Postoperative Access Visit   History of Present Illness  Kenneth MinaDavid G Switala Sr. is a 81 y.o. year old male who presents for postoperative follow-up for: R BC AVF (Date: 06/28/15).  The patient's wounds are healed.  The patient notes no steal symptoms.  The patient is  able to complete their activities of daily living.  The patient's current symptoms are: hip fx related pain.  For VQI Use Only  PRE-ADM LIVING: Home  AMB STATUS: Ambulatory  Physical Examination Filed Vitals:   08/19/15 0829  BP: 112/70  Pulse: 84  Temp: 98.5 F (36.9 C)    RUE: Incision is healed, skin feels warm, hand grip is 5/5, sensation in digits is intact, palpable thrill, bruit can be auscultated, On Sonosite: fistula >= 6.0 mm  Medical Decision Making  Kenneth PyoDavid G Honeyman Sr. is a 81 y.o. year old male who presents s/p R BC AVF.   The patient's access is ready for use.  Thank you for allowing us to participate in this patient's care.  Leonides SakeBrian Chen, MD, FACS [facility] Office: [phone] Pager: [phone]

## 2015-10-24 DIAGNOSIS — J302 Other seasonal allergic rhinitis: Secondary | ICD-10-CM | POA: Diagnosis not present

## 2015-10-24 DIAGNOSIS — M5030 Other cervical disc degeneration, unspecified cervical region: Secondary | ICD-10-CM | POA: Diagnosis not present

## 2015-10-24 DIAGNOSIS — E669 Obesity, unspecified: Secondary | ICD-10-CM | POA: Diagnosis not present

## 2015-10-24 DIAGNOSIS — N2581 Secondary hyperparathyroidism of renal origin: Secondary | ICD-10-CM | POA: Diagnosis not present

## 2015-10-24 DIAGNOSIS — M109 Gout, unspecified: Secondary | ICD-10-CM | POA: Diagnosis not present

## 2015-10-24 DIAGNOSIS — N2589 Other disorders resulting from impaired renal tubular function: Secondary | ICD-10-CM | POA: Diagnosis not present

## 2015-10-24 DIAGNOSIS — I2510 Atherosclerotic heart disease of native coronary artery without angina pectoris: Secondary | ICD-10-CM | POA: Diagnosis not present

## 2015-10-24 DIAGNOSIS — D631 Anemia in chronic kidney disease: Secondary | ICD-10-CM | POA: Diagnosis not present

## 2015-10-24 DIAGNOSIS — I770 Arteriovenous fistula, acquired: Secondary | ICD-10-CM | POA: Diagnosis not present

## 2015-10-24 DIAGNOSIS — M1990 Unspecified osteoarthritis, unspecified site: Secondary | ICD-10-CM | POA: Diagnosis not present

## 2015-10-24 DIAGNOSIS — N184 Chronic kidney disease, stage 4 (severe): Secondary | ICD-10-CM | POA: Diagnosis not present

## 2015-10-24 DIAGNOSIS — I129 Hypertensive chronic kidney disease with stage 1 through stage 4 chronic kidney disease, or unspecified chronic kidney disease: Secondary | ICD-10-CM | POA: Diagnosis not present

## 2015-12-06 DIAGNOSIS — N2589 Other disorders resulting from impaired renal tubular function: Secondary | ICD-10-CM | POA: Diagnosis not present

## 2015-12-06 DIAGNOSIS — E669 Obesity, unspecified: Secondary | ICD-10-CM | POA: Diagnosis not present

## 2015-12-06 DIAGNOSIS — I2510 Atherosclerotic heart disease of native coronary artery without angina pectoris: Secondary | ICD-10-CM | POA: Diagnosis not present

## 2015-12-06 DIAGNOSIS — M1990 Unspecified osteoarthritis, unspecified site: Secondary | ICD-10-CM | POA: Diagnosis not present

## 2015-12-06 DIAGNOSIS — M5030 Other cervical disc degeneration, unspecified cervical region: Secondary | ICD-10-CM | POA: Diagnosis not present

## 2015-12-06 DIAGNOSIS — N184 Chronic kidney disease, stage 4 (severe): Secondary | ICD-10-CM | POA: Diagnosis not present

## 2015-12-06 DIAGNOSIS — I770 Arteriovenous fistula, acquired: Secondary | ICD-10-CM | POA: Diagnosis not present

## 2015-12-06 DIAGNOSIS — N2581 Secondary hyperparathyroidism of renal origin: Secondary | ICD-10-CM | POA: Diagnosis not present

## 2015-12-06 DIAGNOSIS — M109 Gout, unspecified: Secondary | ICD-10-CM | POA: Diagnosis not present

## 2015-12-06 DIAGNOSIS — D631 Anemia in chronic kidney disease: Secondary | ICD-10-CM | POA: Diagnosis not present

## 2015-12-06 DIAGNOSIS — I129 Hypertensive chronic kidney disease with stage 1 through stage 4 chronic kidney disease, or unspecified chronic kidney disease: Secondary | ICD-10-CM | POA: Diagnosis not present

## 2015-12-19 DIAGNOSIS — I10 Essential (primary) hypertension: Secondary | ICD-10-CM | POA: Diagnosis not present

## 2015-12-19 DIAGNOSIS — E663 Overweight: Secondary | ICD-10-CM | POA: Diagnosis not present

## 2015-12-19 DIAGNOSIS — J309 Allergic rhinitis, unspecified: Secondary | ICD-10-CM | POA: Diagnosis not present

## 2015-12-19 DIAGNOSIS — Z6825 Body mass index (BMI) 25.0-25.9, adult: Secondary | ICD-10-CM | POA: Diagnosis not present

## 2015-12-19 DIAGNOSIS — M5416 Radiculopathy, lumbar region: Secondary | ICD-10-CM | POA: Diagnosis not present

## 2015-12-19 DIAGNOSIS — Z1389 Encounter for screening for other disorder: Secondary | ICD-10-CM | POA: Diagnosis not present

## 2015-12-19 DIAGNOSIS — N189 Chronic kidney disease, unspecified: Secondary | ICD-10-CM | POA: Diagnosis not present

## 2015-12-19 DIAGNOSIS — R079 Chest pain, unspecified: Secondary | ICD-10-CM | POA: Diagnosis not present

## 2015-12-19 DIAGNOSIS — R0602 Shortness of breath: Secondary | ICD-10-CM | POA: Diagnosis not present

## 2015-12-19 DIAGNOSIS — Z9181 History of falling: Secondary | ICD-10-CM | POA: Diagnosis not present

## 2015-12-19 DIAGNOSIS — N184 Chronic kidney disease, stage 4 (severe): Secondary | ICD-10-CM | POA: Diagnosis not present

## 2015-12-19 DIAGNOSIS — E7800 Pure hypercholesterolemia, unspecified: Secondary | ICD-10-CM | POA: Diagnosis not present

## 2015-12-23 ENCOUNTER — Emergency Department (HOSPITAL_COMMUNITY): Payer: Medicare Other

## 2015-12-23 ENCOUNTER — Encounter (HOSPITAL_COMMUNITY): Payer: Self-pay

## 2015-12-23 ENCOUNTER — Inpatient Hospital Stay (HOSPITAL_COMMUNITY)
Admission: EM | Admit: 2015-12-23 | Discharge: 2015-12-29 | DRG: 286 | Disposition: A | Discharge status: Home Health | Payer: Medicare Other | Attending: Internal Medicine | Admitting: Internal Medicine

## 2015-12-23 DIAGNOSIS — J8410 Pulmonary fibrosis, unspecified: Secondary | ICD-10-CM

## 2015-12-23 DIAGNOSIS — R0600 Dyspnea, unspecified: Secondary | ICD-10-CM

## 2015-12-23 DIAGNOSIS — M5440 Lumbago with sciatica, unspecified side: Secondary | ICD-10-CM

## 2015-12-23 DIAGNOSIS — N2581 Secondary hyperparathyroidism of renal origin: Secondary | ICD-10-CM | POA: Diagnosis present

## 2015-12-23 DIAGNOSIS — R0609 Other forms of dyspnea: Secondary | ICD-10-CM

## 2015-12-23 DIAGNOSIS — J84112 Idiopathic pulmonary fibrosis: Secondary | ICD-10-CM | POA: Diagnosis present

## 2015-12-23 DIAGNOSIS — Z87891 Personal history of nicotine dependence: Secondary | ICD-10-CM

## 2015-12-23 DIAGNOSIS — I712 Thoracic aortic aneurysm, without rupture: Secondary | ICD-10-CM | POA: Diagnosis present

## 2015-12-23 DIAGNOSIS — Z7982 Long term (current) use of aspirin: Secondary | ICD-10-CM

## 2015-12-23 DIAGNOSIS — I713 Abdominal aortic aneurysm, ruptured: Secondary | ICD-10-CM | POA: Diagnosis not present

## 2015-12-23 DIAGNOSIS — X58XXXA Exposure to other specified factors, initial encounter: Secondary | ICD-10-CM | POA: Diagnosis present

## 2015-12-23 DIAGNOSIS — M6281 Muscle weakness (generalized): Secondary | ICD-10-CM

## 2015-12-23 DIAGNOSIS — G4733 Obstructive sleep apnea (adult) (pediatric): Secondary | ICD-10-CM | POA: Diagnosis present

## 2015-12-23 DIAGNOSIS — J9601 Acute respiratory failure with hypoxia: Secondary | ICD-10-CM | POA: Diagnosis present

## 2015-12-23 DIAGNOSIS — M5416 Radiculopathy, lumbar region: Secondary | ICD-10-CM | POA: Diagnosis not present

## 2015-12-23 DIAGNOSIS — I2729 Other secondary pulmonary hypertension: Secondary | ICD-10-CM | POA: Diagnosis not present

## 2015-12-23 DIAGNOSIS — I50810 Right heart failure, unspecified: Secondary | ICD-10-CM | POA: Diagnosis present

## 2015-12-23 DIAGNOSIS — I700 Atherosclerosis of aorta: Secondary | ICD-10-CM | POA: Diagnosis present

## 2015-12-23 DIAGNOSIS — M109 Gout, unspecified: Secondary | ICD-10-CM | POA: Diagnosis present

## 2015-12-23 DIAGNOSIS — M5126 Other intervertebral disc displacement, lumbar region: Secondary | ICD-10-CM | POA: Diagnosis not present

## 2015-12-23 DIAGNOSIS — M5441 Lumbago with sciatica, right side: Secondary | ICD-10-CM | POA: Diagnosis present

## 2015-12-23 DIAGNOSIS — I2510 Atherosclerotic heart disease of native coronary artery without angina pectoris: Secondary | ICD-10-CM | POA: Diagnosis present

## 2015-12-23 DIAGNOSIS — M549 Dorsalgia, unspecified: Secondary | ICD-10-CM

## 2015-12-23 DIAGNOSIS — I714 Abdominal aortic aneurysm, without rupture, unspecified: Secondary | ICD-10-CM

## 2015-12-23 DIAGNOSIS — R531 Weakness: Secondary | ICD-10-CM | POA: Diagnosis present

## 2015-12-23 DIAGNOSIS — K5730 Diverticulosis of large intestine without perforation or abscess without bleeding: Secondary | ICD-10-CM | POA: Diagnosis present

## 2015-12-23 DIAGNOSIS — Z79899 Other long term (current) drug therapy: Secondary | ICD-10-CM

## 2015-12-23 DIAGNOSIS — K551 Chronic vascular disorders of intestine: Secondary | ICD-10-CM | POA: Diagnosis present

## 2015-12-23 DIAGNOSIS — I10 Essential (primary) hypertension: Secondary | ICD-10-CM

## 2015-12-23 DIAGNOSIS — M1990 Unspecified osteoarthritis, unspecified site: Secondary | ICD-10-CM | POA: Diagnosis present

## 2015-12-23 DIAGNOSIS — N184 Chronic kidney disease, stage 4 (severe): Secondary | ICD-10-CM

## 2015-12-23 DIAGNOSIS — I129 Hypertensive chronic kidney disease with stage 1 through stage 4 chronic kidney disease, or unspecified chronic kidney disease: Secondary | ICD-10-CM | POA: Diagnosis not present

## 2015-12-23 DIAGNOSIS — R262 Difficulty in walking, not elsewhere classified: Secondary | ICD-10-CM | POA: Diagnosis present

## 2015-12-23 DIAGNOSIS — I130 Hypertensive heart and chronic kidney disease with heart failure and stage 1 through stage 4 chronic kidney disease, or unspecified chronic kidney disease: Secondary | ICD-10-CM | POA: Diagnosis present

## 2015-12-23 DIAGNOSIS — F17210 Nicotine dependence, cigarettes, uncomplicated: Secondary | ICD-10-CM | POA: Diagnosis present

## 2015-12-23 DIAGNOSIS — M545 Low back pain: Secondary | ICD-10-CM | POA: Diagnosis not present

## 2015-12-23 DIAGNOSIS — I272 Pulmonary hypertension, unspecified: Secondary | ICD-10-CM

## 2015-12-23 DIAGNOSIS — R29898 Other symptoms and signs involving the musculoskeletal system: Secondary | ICD-10-CM | POA: Diagnosis not present

## 2015-12-23 DIAGNOSIS — Z79891 Long term (current) use of opiate analgesic: Secondary | ICD-10-CM

## 2015-12-23 DIAGNOSIS — M4854XA Collapsed vertebra, not elsewhere classified, thoracic region, initial encounter for fracture: Secondary | ICD-10-CM | POA: Diagnosis present

## 2015-12-23 DIAGNOSIS — E785 Hyperlipidemia, unspecified: Secondary | ICD-10-CM | POA: Diagnosis present

## 2015-12-23 DIAGNOSIS — Z888 Allergy status to other drugs, medicaments and biological substances status: Secondary | ICD-10-CM

## 2015-12-23 LAB — CBC WITH DIFFERENTIAL/PLATELET
BASOS ABS: 0.1 10*3/uL (ref 0.0–0.1)
BASOS PCT: 1 %
Eosinophils Absolute: 0.3 10*3/uL (ref 0.0–0.7)
Eosinophils Relative: 3 %
HEMATOCRIT: 54.8 % — AB (ref 39.0–52.0)
HEMOGLOBIN: 18.8 g/dL — AB (ref 13.0–17.0)
LYMPHS PCT: 17 %
Lymphs Abs: 1.9 10*3/uL (ref 0.7–4.0)
MCH: 33.8 pg (ref 26.0–34.0)
MCHC: 34.3 g/dL (ref 30.0–36.0)
MCV: 98.4 fL (ref 78.0–100.0)
Monocytes Absolute: 0.8 10*3/uL (ref 0.1–1.0)
Monocytes Relative: 8 %
NEUTROS ABS: 7.6 10*3/uL (ref 1.7–7.7)
NEUTROS PCT: 71 %
Platelets: 161 10*3/uL (ref 150–400)
RBC: 5.57 MIL/uL (ref 4.22–5.81)
RDW: 15.2 % (ref 11.5–15.5)
WBC: 10.6 10*3/uL — ABNORMAL HIGH (ref 4.0–10.5)

## 2015-12-23 LAB — COMPREHENSIVE METABOLIC PANEL
ALBUMIN: 3.7 g/dL (ref 3.5–5.0)
ALT: 9 U/L — ABNORMAL LOW (ref 17–63)
ANION GAP: 6 (ref 5–15)
AST: 22 U/L (ref 15–41)
Alkaline Phosphatase: 59 U/L (ref 38–126)
BUN: 25 mg/dL — ABNORMAL HIGH (ref 6–20)
CALCIUM: 9.6 mg/dL (ref 8.9–10.3)
CHLORIDE: 112 mmol/L — AB (ref 101–111)
CO2: 20 mmol/L — AB (ref 22–32)
Creatinine, Ser: 2.09 mg/dL — ABNORMAL HIGH (ref 0.61–1.24)
GFR calc non Af Amer: 28 mL/min — ABNORMAL LOW (ref >60)
GFR, EST AFRICAN AMERICAN: 33 mL/min — AB (ref >60)
GLUCOSE: 92 mg/dL (ref 65–99)
POTASSIUM: 4.2 mmol/L (ref 3.5–5.1)
SODIUM: 138 mmol/L (ref 135–145)
Total Bilirubin: 0.8 mg/dL (ref 0.3–1.2)
Total Protein: 6.2 g/dL — ABNORMAL LOW (ref 6.5–8.1)

## 2015-12-23 LAB — PROTIME-INR
INR: 1.03
Prothrombin Time: 13.6 seconds (ref 11.4–15.2)

## 2015-12-23 LAB — MAGNESIUM: Magnesium: 2.2 mg/dL (ref 1.7–2.4)

## 2015-12-23 LAB — TYPE AND SCREEN
ABO/RH(D): A POS
ANTIBODY SCREEN: NEGATIVE

## 2015-12-23 LAB — ABO/RH: ABO/RH(D): A POS

## 2015-12-23 IMAGING — CT CT ANGIO CHEST-ABD-PELV FOR DISSECTION W/ AND WO/W CM
1 of 2 series · 2 of 30 positions shown, 3 images · IV contrast (Iohexol (Omnipaque 350))
Comparison: MRI from 12/23/2015, 08/29/2007 ultrasound in

CLINICAL DATA: Abdominal aortic aneurysm with worsening back pain.

EXAM:
CT ANGIOGRAPHY CHEST, ABDOMEN AND PELVIS
TECHNIQUE: Multidetector CT imaging through the chest, abdomen and pelvis was
performed using the standard protocol during bolus administration of
intravenous contrast. Multiplanar reconstructed images and MIPs were
obtained and reviewed to evaluate the vascular anatomy.
CONTRAST:  100 cc of Isovue 370 IV

[Series 502: coronals, idose (1) · coronal · 0.45mm/px · 2 of 178 slices shown, 3 images]
[im 55/178  mediastinal]
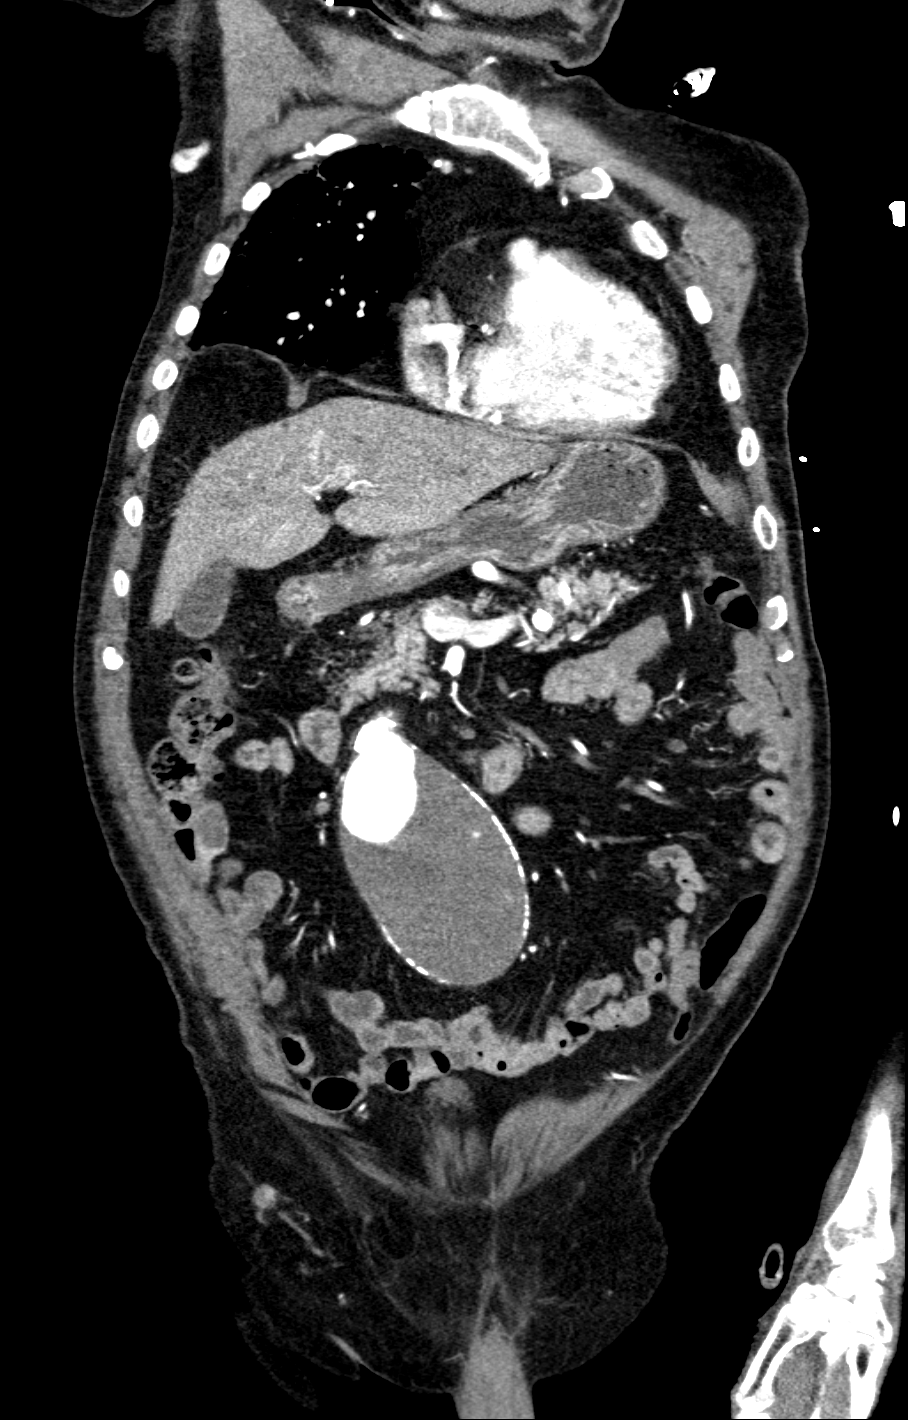
[im 55/178  bone]
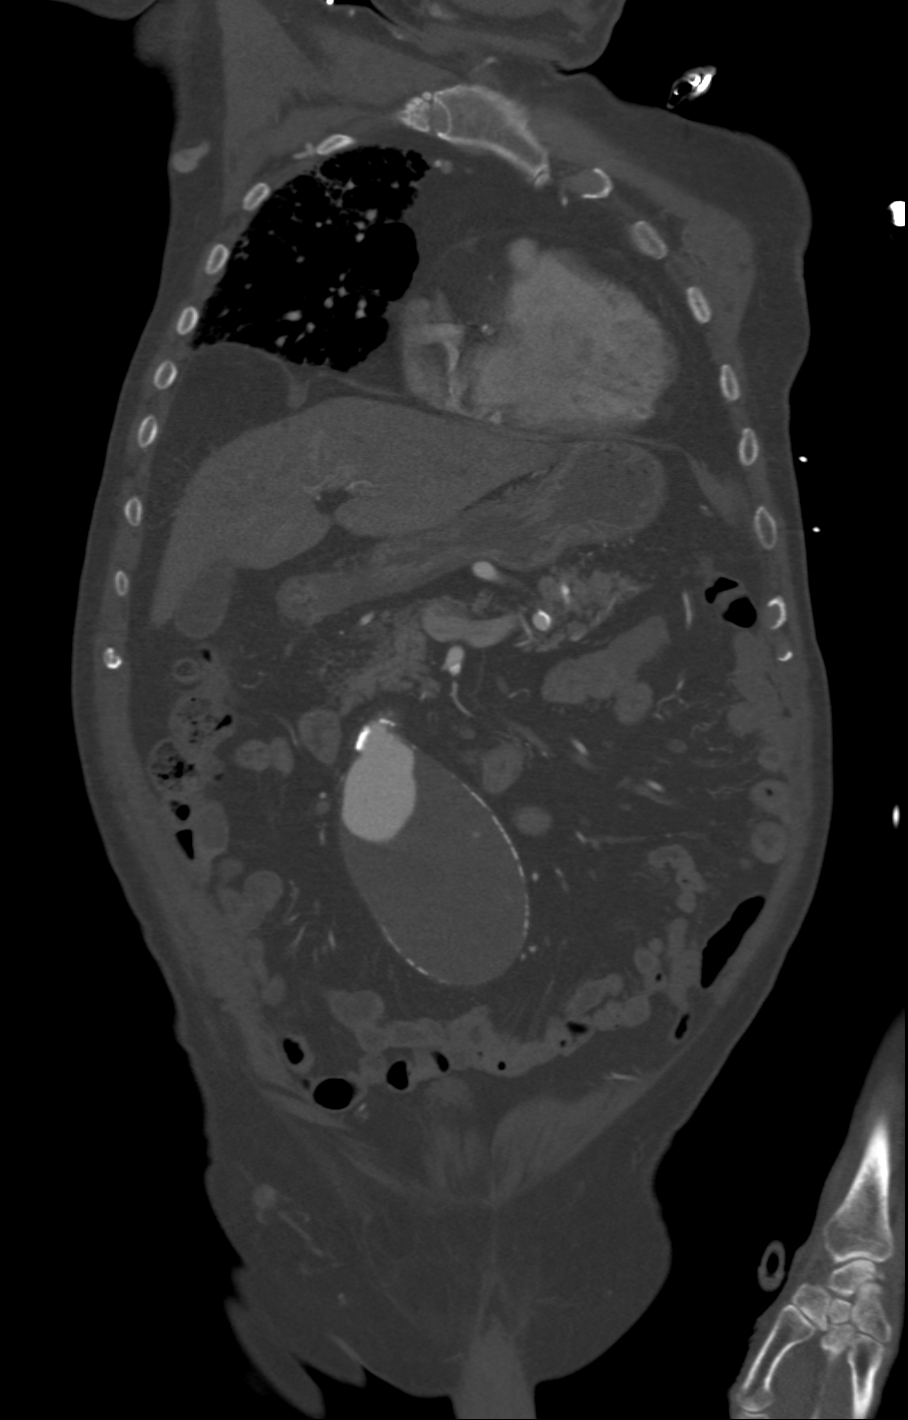
[im 123/178  mediastinal]
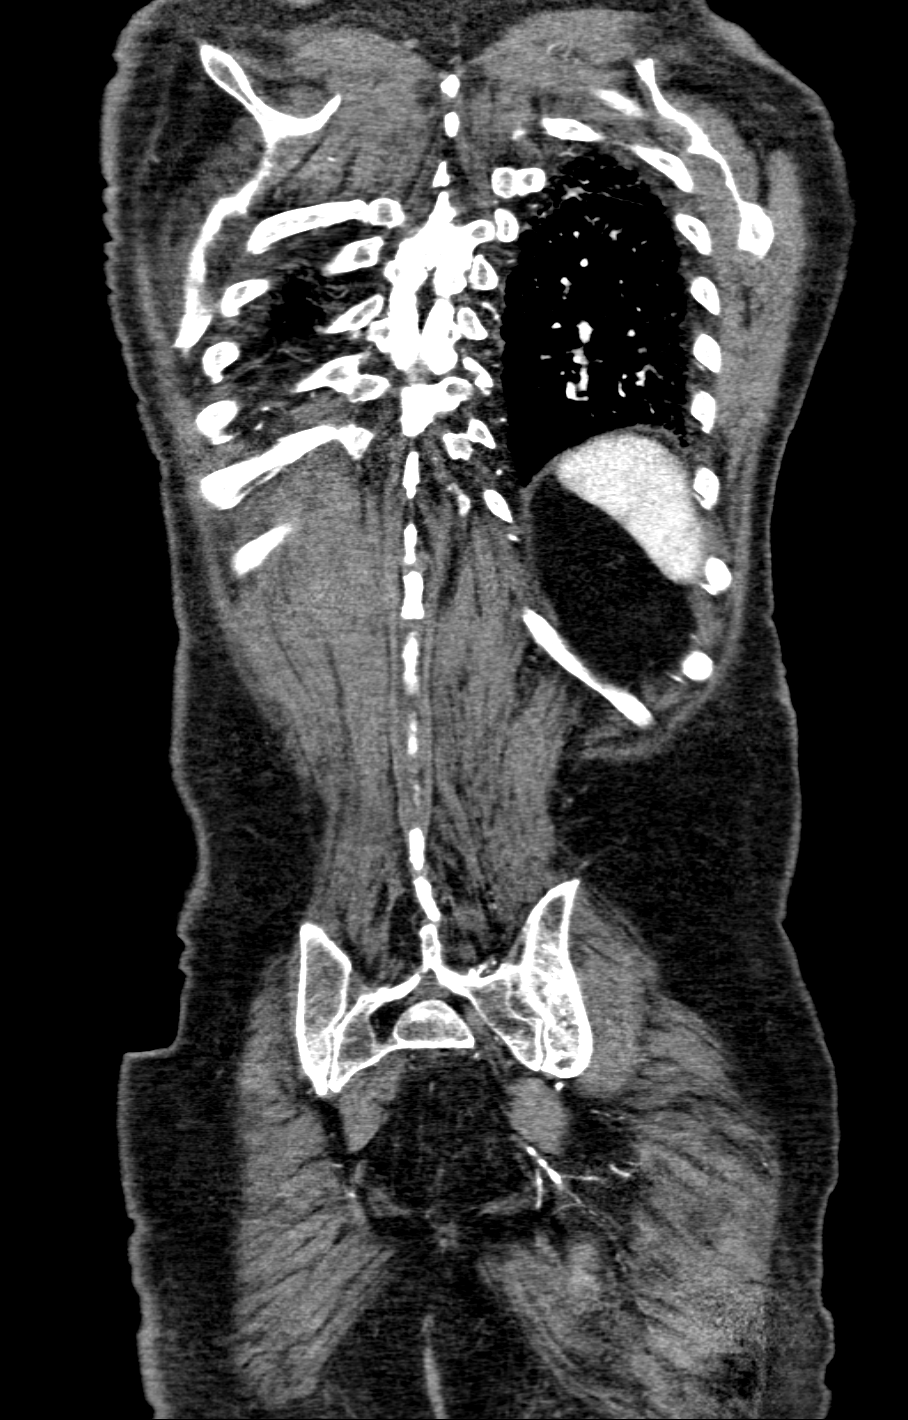

[2 of 30 positions shown; findings below may reference images not displayed]

FINDINGS: CTA CHEST FINDINGS

Cardiovascular: Satisfactory opacification of the pulmonary arteries
to the segmental level. No evidence of pulmonary embolism. The heart
is enlarged. No pericardial effusion. There is three-vessel coronary
arteriosclerosis. No thoracic aortic aneurysm nor dissection.

Mediastinum/Nodes: No enlarged mediastinal, hilar, or axillary lymph
nodes. Thyroid gland, trachea, and esophagus demonstrate no
significant findings.

Lungs/Pleura: Bilateral paraseptal and centrilobular emphysema with
superimposed ground-glass opacities which may represent sequela mild
pulmonary edema.

Musculoskeletal: Anterior compression of T7 which appears chronic
without evidence of paraspinal hematoma or retropulsion.
Degenerative disc disease from T1 through T5 and T7 through T11.

Review of the MIP images confirms the above findings.

CTA ABDOMEN AND PELVIS FINDINGS

VASCULAR

Aorta: There is a fusiform infrarenal abdominal aortic aneurysm
measuring up to 8.1 cm transverse. The proximal neck measures 2.2 cm
in caliber and is approximately 3.5 cm from the left renal artery
origin. The distal aspect of the fusiform aortic aneurysm extends to
the aortic bifurcation were there is an ectatic appearance of both
common iliac arteries, on the right measuring up to 1.9 cm and on
the left, appearing more aneurysmal measuring 2.9 cm with mild
circumferential soft plaque. The external iliac artery on the right
measures 0.6 cm and on the left 0.8 cm and are patent.

No evidence of abdominal aortic leak. No retroperitoneal fluid is
seen. Along the left lateral soft plaque are developing
calcifications which account for the hyperdensity seen. No
communication with intraluminal contrast to suggested an ulcerated
plaque.

There is a small thrombosed 1.5 cm aneurysm of what appears to be a
lumbar artery branch, series 501 image 170.

Celiac: Patent without evidence of aneurysm, dissection, vasculitis
or significant stenosis. Atherosclerosis is noted at the origin.

SMA: Atherosclerosis at the origin of the SMA. No dissection or
aneurysm.

Renals: Single renal arteries to both kidneys, attenuated on the
right measuring 4 mm in caliber and 9 mm on the left. Small webs are
noted in the proximal renal arteries.

IMA: Patent without evidence of aneurysm, dissection, vasculitis or
significant stenosis.

Inflow: Patent without evidence of aneurysm, dissection, vasculitis
or significant stenosis.Atherosclerosis of the descending thoracic
aorta without aneurysm.

Veins: No obvious venous abnormality within the limitations of this
arterial phase study.

Review of the MIP images confirms the above findings.

NON-VASCULAR

Hepatobiliary: There are too numerous to count hypodensities within
the liver statistically consistent with cysts and/or hemangiomata.
There is a granuloma in the right hepatic lobe. The gallbladder is
not distended but contains several calculi. No wall thickening is
noted.

Pancreas: Fatty atrophy of the pancreas without ductal dilatation or
focal mass.

Spleen: The spleen is not enlarged.

Adrenals/Urinary Tract: Normal bilateral adrenal glands. Right worse
than left renal cortical atrophy with bilateral simple and complex
renal cysts. Extrarenal pelves are seen bilaterally. Urinary bladder
is distended without acute abnormalities.

Stomach/Bowel: Normal bowel rotation. No acute inflammation. There
is colonic diverticulosis without acute diverticulitis.

Lymphatic: No lymphadenopathy.

Reproductive: Prostate appears unremarkable.

Other: No ascites or pneumoperitoneum.

Musculoskeletal: No acute osseous abnormality.  Lumbar spondylosis.

Review of the MIP images confirms the above findings.
IMPRESSION: 1. Infrarenal fusiform abdominal aortic aneurysm measuring 8 cm
transverse without evidence of acute intramural hematoma or aortic
leak. Developing calcifications seen within the soft plaque along
the left lateral aspect of this aneurysm. The aneurysm is 3.5 cm
from the renal arteries it measures approximately 2.2 cm in caliber.
The aneurysm terminates at the aortic bifurcation. There is ectasia
of the right common iliac artery to 1.9 cm in and more aneurysmal
dilatation of the left common iliac artery to 2.9 cm with plaque
noted. The external iliac arteries are patent measuring 0.6 cm on
the right and 0.8 cm on the left.

2. Numerous hepatic and bilateral renal hypodensities consistent
with cysts. Atrophic appearing kidneys bilaterally.

3. No pulmonary embolus or thoracic aortic aneurysm. No aortic
dissection. All

4.  Pulmonary emphysema.

5. No acute thoracic abnormality. Chronic appearing T7 compression.

Critical Value/emergent results were called by telephone at the time
of interpretation on 12/23/2015 at [DATE] to Dr. GUELLAL MAREYOL ,
who verbally acknowledged these results and earlier to Dr. Beaulieu
Shatlyk of Vascular Surgery.

## 2015-12-23 MED ORDER — FLUTICASONE PROPIONATE 50 MCG/ACT NA SUSP
1.0000 | Freq: Every day | NASAL | Status: DC | PRN
  Filled 2015-12-23: qty 16

## 2015-12-23 MED ORDER — ACETAMINOPHEN 650 MG RE SUPP: 650.0000 mg | Freq: Four times a day (QID) | RECTAL | Status: DC | PRN

## 2015-12-23 MED ORDER — ASPIRIN EC 81 MG PO TBEC
81.0000 mg | DELAYED_RELEASE_TABLET | Freq: Every day | ORAL | Status: DC
  Administered 2015-12-24 – 2015-12-25 (×2): 81 mg via ORAL
  Filled 2015-12-23 (×3): qty 1

## 2015-12-23 MED ORDER — ACETAMINOPHEN 325 MG PO TABS: 650.0000 mg | ORAL_TABLET | Freq: Four times a day (QID) | ORAL | Status: DC | PRN

## 2015-12-23 MED ORDER — RENA-VITE RX 1 MG PO TABS: 1.0000 mg | ORAL_TABLET | Freq: Every day | ORAL | Status: DC

## 2015-12-23 MED ORDER — ALBUTEROL SULFATE HFA 108 (90 BASE) MCG/ACT IN AERS: 1.0000 | INHALATION_SPRAY | RESPIRATORY_TRACT | Status: DC | PRN

## 2015-12-23 MED ORDER — METOPROLOL SUCCINATE ER 50 MG PO TB24
50.0000 mg | ORAL_TABLET | Freq: Every day | ORAL | Status: DC
  Administered 2015-12-23 – 2015-12-25 (×3): 50 mg via ORAL
  Filled 2015-12-23 (×3): qty 1

## 2015-12-23 MED ORDER — RENA-VITE PO TABS
1.0000 | ORAL_TABLET | Freq: Every day | ORAL | Status: DC
  Administered 2015-12-24 – 2015-12-25 (×2): 1 via ORAL
  Filled 2015-12-23 (×2): qty 1

## 2015-12-23 MED ORDER — ALBUTEROL SULFATE (2.5 MG/3ML) 0.083% IN NEBU: 2.5000 mg | INHALATION_SOLUTION | Freq: Four times a day (QID) | RESPIRATORY_TRACT | Status: DC | PRN

## 2015-12-23 MED ORDER — SODIUM CHLORIDE 0.9 % IV BOLUS (SEPSIS)
1000.0000 mL | Freq: Once | INTRAVENOUS | Status: AC
  Administered 2015-12-23: 1000 mL via INTRAVENOUS

## 2015-12-23 MED ORDER — CALCIUM ACETATE (PHOS BINDER) 667 MG PO CAPS
667.0000 mg | ORAL_CAPSULE | Freq: Two times a day (BID) | ORAL | Status: DC
  Administered 2015-12-24 – 2015-12-25 (×4): 667 mg via ORAL
  Filled 2015-12-23 (×4): qty 1

## 2015-12-23 MED ORDER — SODIUM BICARBONATE 650 MG PO TABS
1300.0000 mg | ORAL_TABLET | Freq: Two times a day (BID) | ORAL | Status: DC
  Administered 2015-12-23 – 2015-12-25 (×5): 1300 mg via ORAL
  Filled 2015-12-23 (×6): qty 2

## 2015-12-23 MED ORDER — ROSUVASTATIN CALCIUM 10 MG PO TABS
20.0000 mg | ORAL_TABLET | Freq: Every day | ORAL | Status: DC
  Administered 2015-12-24 – 2015-12-25 (×2): 20 mg via ORAL
  Filled 2015-12-23 (×3): qty 2

## 2015-12-23 MED ORDER — LORATADINE 10 MG PO TABS
10.0000 mg | ORAL_TABLET | Freq: Every day | ORAL | Status: DC
  Administered 2015-12-23 – 2015-12-25 (×3): 10 mg via ORAL
  Filled 2015-12-23 (×3): qty 1

## 2015-12-23 MED ORDER — ALLOPURINOL 300 MG PO TABS
300.0000 mg | ORAL_TABLET | Freq: Every day | ORAL | Status: DC
  Administered 2015-12-24 – 2015-12-25 (×2): 300 mg via ORAL
  Filled 2015-12-23 (×3): qty 1

## 2015-12-23 MED ORDER — SODIUM CHLORIDE 0.9 % IV SOLN
INTRAVENOUS | Status: DC
  Administered 2015-12-23: 21:00:00 via INTRAVENOUS

## 2015-12-23 MED ORDER — IOPAMIDOL (ISOVUE-370) INJECTION 76%
INTRAVENOUS | Status: AC
  Administered 2015-12-23: 100 mL
  Filled 2015-12-23: qty 100

## 2015-12-23 NOTE — Consult Note (Signed)
   Referred by: Dr. Erin Schlossmann ([facility] ED)  Reason for referral: possible contained ruptured AAA  History of Present Illness  The patient is a 81 y.o. (09/06/1934) male who presents with chief complaint: back pain.  This patient had recurrence of his previous back pain associated with DDD with radicular sx in his leg similar to previous episodes.  The patient had a MRI completed at an outside facility which was read as possible contained rupture of AAA.  Pt currently denies back or abdominal pain.  The patient notes no history of embolic episodes from the AAA.  The patient's risk factors for AAA included: age and sex and prior smoker.  The patient does not smoke cigarettes.  Past Medical History:  Diagnosis Date  . Arteriosclerotic cardiovascular disease   . Chronic kidney disease   . Chronic kidney disease, stage IV (severe) (HCC)   . DJD (degenerative joint disease)   . Hypertension   . Hypertensive renal disease   . Renal tubular acidosis   . Secondary hyperparathyroidism (HCC)     Past Surgical History:  Procedure Laterality Date  . APPENDECTOMY    . AV FISTULA PLACEMENT Right 06/28/2015   Procedure: RIGHT BRACHIOCEPHALIC ARTERIOVENOUS (AV) FISTULA CREATION;  Surgeon: Brian L Chen, MD;  Location: [facility] OR;  Service: Vascular;  Laterality: Right;  . HIP FRACTURE SURGERY Left   . MULTIPLE TOOTH EXTRACTIONS    . TONSILLECTOMY      Social History   Social History  . Marital status: Married    Spouse name: N/A  . Number of children: N/A  . Years of education: N/A   Occupational History  . Not on file.   Social History Main Topics  . Smoking status: Former Smoker    Types: Cigarettes  . Smokeless tobacco: Current User    Types: Chew     Comment: " years ago i quit smoking cigarettes"  . Alcohol use No  . Drug use: No  . Sexual activity: Not on file   Other Topics Concern  . Not on file   Social History Narrative  . No narrative on file    Family History    Problem Relation Age of Onset  . Diabetes Mother   . Stroke Father   . AAA (abdominal aortic aneurysm) Father     No current facility-administered medications for this encounter.    Current Outpatient Prescriptions  Medication Sig Dispense Refill  . allopurinol (ZYLOPRIM) 300 MG tablet Take 300 mg by mouth daily.    . aspirin EC 81 MG tablet Take 81 mg by mouth daily.    . B Complex-C-Folic Acid (RENA-VITE RX) 1 MG TABS Take 1 mg by mouth daily.    . calcium acetate (PHOSLO) 667 MG capsule Take 667 mg by mouth 2 (two) times daily with a meal.    . fluticasone (FLONASE) 50 MCG/ACT nasal spray Place 1 spray into both nostrils daily as needed.    . loratadine (CLARITIN) 10 MG tablet Take 10 mg by mouth at bedtime.    . metoprolol succinate (TOPROL-XL) 50 MG 24 hr tablet Take 50 mg by mouth at bedtime. Take with or immediately following a meal.    . rosuvastatin (CRESTOR) 20 MG tablet Take 20 mg by mouth daily.    . sodium bicarbonate 650 MG tablet Take 1,300 mg by mouth 2 (two) times daily.    . VENTOLIN HFA 108 (90 Base) MCG/ACT inhaler Inhale 1 puff into the lungs every 4 (four)   hours as needed for wheezing.    . oxyCODONE-acetaminophen (ROXICET) 5-325 MG tablet Take 1-2 tablets by mouth every 6 (six) hours as needed for moderate pain. (Patient not taking: Reported on 12/23/2015) 30 tablet 0     Allergies  Allergen Reactions  . Phenobarbital Hives and Itching  . Prednisone Rash     Review of Systems (Positive items checked otherwise negative)  General: [ ] Weight loss, [ ] Weight gain, [ ]  Loss of appetite, [ ] Fever  Neurologic: [ ] Dizziness, [ ] Blackouts, [ ] Headaches, [ ] Seizure  Ear/Nose/Throat: [ ] Change in eyesight, [ ] Change in hearing, [ ] Nose bleeds, [ ] Sore throat  Vascular: [ ] Pain in legs with walking, [ ] Pain in feet while lying flat, [ ] Non-healing ulcer, Stroke, [ ] "Mini stroke", [ ] Slurred speech, [ ] Temporary blindness, [ ] Blood clot in vein, [  ] Phlebitis  Pulmonary: [ ] Home oxygen, [ ] Productive cough, [ ] Bronchitis, [ ] Coughing up blood, [ ] Asthma, [ ] Wheezing  Musculoskeletal: [ ] Arthritis, [ ] Joint pain, [ ] Muscle pain, [x] chronic back pain  Cardiac: [ ] Chest pain, [ ] Chest tightness/pressure, [ ] Shortness of breath when lying flat, [ ] Shortness of breath with exertion, [ ] Palpitations, [ ] Heart murmur, [ ] Arrythmia,  [ ] Atrial fibrillation  Hematologic: [ ] Bleeding problems, [ ] Clotting disorder, [ ] Anemia  Psychiatric:  [ ] Depression, [ ] Anxiety, [ ] Attention deficit disorder  Gastrointestinal:  [ ] Black stool,[ ]  Blood in stool, [ ] Peptic ulcer disease, [ ] Reflux, [ ] Hiatal hernia, [ ] Trouble swallowing, [ ] Diarrhea, [ ] Constipation  Urinary:  [ ] Kidney disease, [ ] Burning with urination, [ ] Frequent urination, [ ] Difficulty urinating, [x] CKD stage IV  Skin: [ ] Ulcers, [ ] Rashes   For VQI Use Only  PRE-ADM LIVING: Home  AMB STATUS: Ambulatory  CAD Sx: None  PRIOR CHF: None  STRESS TEST: No   Physical Examination  Vitals:   12/23/15 1530 12/23/15 1630 12/23/15 1645 12/23/15 1700  BP: 136/92 128/82 139/83 145/82  Pulse: 77  79 72  Resp: 16 14 18 22  Temp:      TempSrc:      SpO2: 93%  92% 91%  Weight:      Height:        Body mass index is 26.15 kg/m.  General: Alert, O x 3, WD,NAD  Head: [place]/AT,   Ear/Nose/Throat: Hearing grossly intact, nares without erythema or drainage, oropharynx without Erythema or Exudate , Mallampati score: 3, Dentition intact  Eyes: PERRLA, EOMI,   Neck: Supple, mid-line trachea,    Pulmonary: Sym exp, good B air movt,CTA B  Cardiac: RRR, Nl S1, S2, no Murmurs, No rubs, No S3,S4  Vascular: Vessel Right Left  Radial Faintly palpable Palpable  Brachial Palpable Palpable  Carotid Palpable, No Bruit Palpable, No Bruit  Aorta Not palpable N/A  Femoral Palpable Palpable  Popliteal Not palpable Not palpable  PT Palpable  Palpable  DP Palpable Palpable   Gastrointestinal: soft, non-distended, non-tender to palpation, No guarding or rebound, no HSM, faintly palpable AAA in RUQ and epigastrium, no CVAT B,    Musculoskeletal: M/S 5/5 throughout  , Extremities without ischemic changes  , No edema present, No LDS present, palpable thrill in R upper arm strong   thrill  Neurologic: CN 2-12 intact , Pain and light touch intact in extremities , Motor exam as listed above  Psychiatric: Judgement intact, Mood & affect appropriate for pt's clinical situation  Dermatologic: See M/S exam for extremity exam, No rashes otherwise noted  Lymph : Palpable lymph nodes: None   Laboratory: CBC:    Component Value Date/Time   WBC 10.6 (H) 12/23/2015 1445   RBC 5.57 12/23/2015 1445   HGB 18.8 (H) 12/23/2015 1445   HCT 54.8 (H) 12/23/2015 1445   PLT 161 12/23/2015 1445   MCV 98.4 12/23/2015 1445   MCH 33.8 12/23/2015 1445   MCHC 34.3 12/23/2015 1445   RDW 15.2 12/23/2015 1445   LYMPHSABS 1.9 12/23/2015 1445   MONOABS 0.8 12/23/2015 1445   EOSABS 0.3 12/23/2015 1445   BASOSABS 0.1 12/23/2015 1445    BMP:    Component Value Date/Time   NA 138 12/23/2015 1645   K 4.2 12/23/2015 1645   CL 112 (H) 12/23/2015 1645   CO2 20 (L) 12/23/2015 1645   GLUCOSE 92 12/23/2015 1645   BUN 25 (H) 12/23/2015 1645   CREATININE 2.09 (H) 12/23/2015 1645   CALCIUM 9.6 12/23/2015 1645   GFRNONAA 28 (L) 12/23/2015 1645   GFRAA 33 (L) 12/23/2015 1645    Coagulation: Lab Results  Component Value Date   INR 1.03 12/23/2015   No results found for: PTT  Lipids: No results found for: CHOL, TRIG, HDL, CHOLHDL, VLDL, LDLCALC, LDLDIRECT  Radiology: Ct Angio Chest/abd/pel For Dissection W And/or W/wo  Result Date: 12/23/2015 CLINICAL DATA:  Abdominal aortic aneurysm with worsening back pain. EXAM: CT ANGIOGRAPHY CHEST, ABDOMEN AND PELVIS TECHNIQUE: Multidetector CT imaging through the chest, abdomen and pelvis was performed using  the standard protocol during bolus administration of intravenous contrast. Multiplanar reconstructed images and MIPs were obtained and reviewed to evaluate the vascular anatomy. CONTRAST:  100 cc of Isovue 370 IV COMPARISON:  MRI from 12/23/2015, 08/29/2007 ultrasound in FINDINGS: CTA CHEST FINDINGS Cardiovascular: Satisfactory opacification of the pulmonary arteries to the segmental level. No evidence of pulmonary embolism. The heart is enlarged. No pericardial effusion. There is three-vessel coronary arteriosclerosis. No thoracic aortic aneurysm nor dissection. Mediastinum/Nodes: No enlarged mediastinal, hilar, or axillary lymph nodes. Thyroid gland, trachea, and esophagus demonstrate no significant findings. Lungs/Pleura: Bilateral paraseptal and centrilobular emphysema with superimposed ground-glass opacities which may represent sequela mild pulmonary edema. Musculoskeletal: Anterior compression of T7 which appears chronic without evidence of paraspinal hematoma or retropulsion. Degenerative disc disease from T1 through T5 and T7 through T11. Review of the MIP images confirms the above findings. CTA ABDOMEN AND PELVIS FINDINGS VASCULAR Aorta: There is a fusiform infrarenal abdominal aortic aneurysm measuring up to 8.1 cm transverse. The proximal neck measures 2.2 cm in caliber and is approximately 3.5 cm from the left renal artery origin. The distal aspect of the fusiform aortic aneurysm extends to the aortic bifurcation were there is an ectatic appearance of both common iliac arteries, on the right measuring up to 1.9 cm and on the left, appearing more aneurysmal measuring 2.9 cm with mild circumferential soft plaque. The external iliac artery on the right measures 0.6 cm and on the left 0.8 cm and are patent. No evidence of abdominal aortic leak. No retroperitoneal fluid is seen. Along the left lateral soft plaque are developing calcifications which account for the hyperdensity seen. No communication with  intraluminal contrast to suggested an ulcerated plaque. There is a small thrombosed 1.5 cm aneurysm of what appears to   be a lumbar artery branch, series 501 image 170. Celiac: Patent without evidence of aneurysm, dissection, vasculitis or significant stenosis. Atherosclerosis is noted at the origin. SMA: Atherosclerosis at the origin of the SMA. No dissection or aneurysm. Renals: Single renal arteries to both kidneys, attenuated on the right measuring 4 mm in caliber and 9 mm on the left. Small webs are noted in the proximal renal arteries. IMA: Patent without evidence of aneurysm, dissection, vasculitis or significant stenosis. Inflow: Patent without evidence of aneurysm, dissection, vasculitis or significant stenosis.Atherosclerosis of the descending thoracic aorta without aneurysm. Veins: No obvious venous abnormality within the limitations of this arterial phase study. Review of the MIP images confirms the above findings. NON-VASCULAR Hepatobiliary: There are too numerous to count hypodensities within the liver statistically consistent with cysts and/or hemangiomata. There is a granuloma in the right hepatic lobe. The gallbladder is not distended but contains several calculi. No wall thickening is noted. Pancreas: Fatty atrophy of the pancreas without ductal dilatation or focal mass. Spleen: The spleen is not enlarged. Adrenals/Urinary Tract: Normal bilateral adrenal glands. Right worse than left renal cortical atrophy with bilateral simple and complex renal cysts. Extrarenal pelves are seen bilaterally. Urinary bladder is distended without acute abnormalities. Stomach/Bowel: Normal bowel rotation. No acute inflammation. There is colonic diverticulosis without acute diverticulitis. Lymphatic: No lymphadenopathy. Reproductive: Prostate appears unremarkable. Other: No ascites or pneumoperitoneum. Musculoskeletal: No acute osseous abnormality.  Lumbar spondylosis. Review of the MIP images confirms the above  findings. IMPRESSION: 1. Infrarenal fusiform abdominal aortic aneurysm measuring 8 cm transverse without evidence of acute intramural hematoma or aortic leak. Developing calcifications seen within the soft plaque along the left lateral aspect of this aneurysm. The aneurysm is 3.5 cm from the renal arteries it measures approximately 2.2 cm in caliber. The aneurysm terminates at the aortic bifurcation. There is ectasia of the right common iliac artery to 1.9 cm in and more aneurysmal dilatation of the left common iliac artery to 2.9 cm with plaque noted. The external iliac arteries are patent measuring 0.6 cm on the right and 0.8 cm on the left. 2. Numerous hepatic and bilateral renal hypodensities consistent with cysts. Atrophic appearing kidneys bilaterally. 3. No pulmonary embolus or thoracic aortic aneurysm. No aortic dissection. All 4.  Pulmonary emphysema. 5. No acute thoracic abnormality. Chronic appearing T7 compression. Critical Value/emergent results were called by telephone at the time of interpretation on 12/23/2015 at 5:13 pm to Dr. ERIN SCHLOSSMAN , who verbally acknowledged these results and earlier to Dr. Brian Chen of Vascular Surgery. Electronically Signed   By: Deven  Kwon M.D.   On: 12/23/2015 17:14   I reviewed the patient's CTA chest/abd/pelvis, he has a large infrarenal AAA that might be compatible with EVAR repair.  There is significant angulation but I suspect there is enough to neck to place an endograft with possible need for a cuff due to the angulation.  There appears to be small B CIA aneurysm also that would be repaired at the same time.   Medical Decision Making  The patient is a 81 y.o. male who presents with: chronic kidney disease stage IV s/p R BC AVF, large asx AAA, chronically sx degenerative disc disease (DDD)   This pt has sx DDD that resulted in incidentally found large AAA.  There is no evidence of rupture on the CTA despite the MRI findings.  There is NO  indicate for emergent repair of this large AAA.  Due to his CKD IV and contrast load needed   for the CTA, I recommend admission to Hospitalist service for IV hydration and observation for possible contrast induced nephropathy leading to ARF.  While admitted, would get Cardiac consultation to expedite preop evaluation.  Will get the CTA reviewed with my [employer] rep on Monday as I suspect his anatomy is outside of the IFU for the C3 device.  I still suspect it can be repaired with such.  Would subsequently plan on proceeding with EVAR the week of 4th of Dec.  Assuming preop Cardiology evaluation completed, to facilitate preop Anes evaluation.  Will continue to follow with you.  Thank you for allowing us to participate in this patient's care.   Brian Chen, MD [facility] Office: [phone] Pager: [phone]  12/23/2015, 5:17 PM     

## 2015-12-23 NOTE — ED Notes (Signed)
Vascular Surgery at the bedside 

## 2015-12-23 NOTE — ED Notes (Signed)
Placed patient into a gown and on the monitor 

## 2015-12-23 NOTE — ED Notes (Signed)
Ordered HH tray  

## 2015-12-23 NOTE — ED Notes (Signed)
attempted x1 

## 2015-12-23 NOTE — ED Notes (Signed)
Patient ordered Meal Tray.

## 2015-12-23 NOTE — ED Triage Notes (Signed)
Per Pt, Pt is coming from home after an MRI done today at 1200 due to patient's chronic lower back pain that has worsened. Pt's MD called after they saw the MRI and stated he needed to come to the ED emergently with Thoracic Aorta Aneurysm. Pt was sent POV

## 2015-12-23 NOTE — H&P (Signed)
History and Physical    Kenneth MinaDavid G Leatham Sr. ZOX:[id number] DOB: 07/12/1934 DOA: 12/23/2015  Referring [name]/NP/PA: ER PCP: [name] Outpatient Specialists: [name], DETERDING Patient coming from: home  Chief Complaint: sent in after MRI Of back  HPI: Kenneth PyoDavid G Olivar Sr. is a 81 y.o. male with medical history significant of chronic kidney disease stage IV, hypertension, hyperlipidemia. Patient was having back pain and his PCP sent him for an MRI of his back. Patient had returned home after the MRI when he was called by radiology and told to come to the ER for an incidental finding of a thoracic aortic aneurysm.  Patient's back pain previously had been associated with degenerative disc disease with radicular symptoms in his legs. Patient had a CTA in the ER of the chest abdomen and pelvis. He was found to have a large infrarenal AAA that may be compatible for EVA R repair. He was seen by Dr. Claudie Fishermanhin in the ER suggested that patient be watched in the hospital after receiving IV contrast with chronic kidney disease stage IV. Per patient his normal GFR ranges between 15-20. He follows with [name] on a regular basis.  Per vascular they would like a cardiology consult to get a preop evaluation. Tentatively surgery is planned for December 4.    Review of Systems: all systems reviewed, negative unless stated above in HPI   Past Medical History:  Diagnosis Date  . Arteriosclerotic cardiovascular disease   . Chronic kidney disease   . Chronic kidney disease, stage IV (severe) (HCC)   . DJD (degenerative joint disease)   . Hypertension   . Hypertensive renal disease   . Renal tubular acidosis   . Secondary hyperparathyroidism [facility](HCC)     Past Surgical History:  Procedure Laterality Date  . APPENDECTOMY    . AV FISTULA PLACEMENT Right 06/28/2015   Procedure: RIGHT BRACHIOCEPHALIC ARTERIOVENOUS (AV) FISTULA CREATION;  Surgeon: Fransisco HertzBrian L Chen, [name];  [facility];  Service: Vascular;  Laterality:  Right;  . HIP FRACTURE SURGERY Left   . MULTIPLE TOOTH EXTRACTIONS    . TONSILLECTOMY       reports that he has quit smoking. His smoking use included Cigarettes. His smokeless tobacco use includes Chew. He reports that he does not drink alcohol or use drugs.  Allergies  Allergen Reactions  . Phenobarbital Hives and Itching  . Prednisone Rash    Family History  Problem Relation Age of Onset  . Diabetes Mother   . Stroke Father   . AAA (abdominal aortic aneurysm) Father      Prior to Admission medications   Medication Sig Start Date End Date Taking? Authorizing Provider  allopurinol (ZYLOPRIM) 300 MG tablet Take 300 mg by mouth daily.   Yes Historical Provider, [name]  aspirin EC 81 MG tablet Take 81 mg by mouth daily.   Yes Historical Provider, [name]  B Complex-C-Folic Acid (RENA-VITE RX) 1 MG TABS Take 1 mg by mouth daily.   Yes Historical Provider, [name]  calcium acetate (PHOSLO) 667 MG capsule Take 667 mg by mouth 2 (two) times daily with a meal.   Yes Historical Provider, [name]  fluticasone (FLONASE) 50 MCG/ACT nasal spray Place 1 spray into both nostrils daily as needed. 06/15/15  Yes Historical Provider, [name]  loratadine (CLARITIN) 10 MG tablet Take 10 mg by mouth at bedtime.   Yes Historical Provider, [name]  metoprolol succinate (TOPROL-XL) 50 MG 24 hr tablet Take 50 mg by mouth at bedtime. Take with or immediately following  a meal.   Yes Historical Provider, [name]  rosuvastatin (CRESTOR) 20 MG tablet Take 20 mg by mouth daily. 12/20/15  Yes Historical Provider, [name]  sodium bicarbonate 650 MG tablet Take 1,300 mg by mouth 2 (two) times daily.   Yes Historical Provider, [name]  VENTOLIN HFA 108 (90 Base) MCG/ACT inhaler Inhale 1 puff into the lungs every 4 (four) hours as needed for wheezing. 12/20/15  Yes Historical Provider, [name]  oxyCODONE-acetaminophen (ROXICET) 5-325 MG tablet Take 1-2 tablets by mouth every 6 (six) hours as needed for moderate pain. Patient not taking: Reported on 12/23/2015  06/28/15   Fransisco HertzBrian L Chen, [name]    Physical Exam: Vitals:   12/23/15 1745 12/23/15 1800 12/23/15 1815 12/23/15 1830  BP: 134/81 129/82 134/78 129/89  Pulse: 72 69 64 63  Resp: 16 11 15 15   Temp:      TempSrc:      SpO2: 93% 94% 94% 94%  Weight:      Height:          Constitutional: NAD, calm, comfortable Vitals:   12/23/15 1745 12/23/15 1800 12/23/15 1815 12/23/15 1830  BP: 134/81 129/82 134/78 129/89  Pulse: 72 69 64 63  Resp: 16 11 15 15   Temp:      TempSrc:      SpO2: 93% 94% 94% 94%  Weight:      Height:       Eyes: PERRL, lids and conjunctivae normal ENMT: Mucous membranes are moist. Posterior pharynx clear of any exudate or lesions.Normal dentition.  Neck: normal, supple, no masses, no thyromegaly Respiratory: clear to auscultation bilaterally, no wheezing, no crackles. Normal respiratory effort. No accessory muscle use.  Cardiovascular: Regular rate and rhythm, no murmurs / rubs / gallops. No extremity edema. 2+ pedal pulses. No carotid bruits.  Abdomen: no tenderness, no masses palpated. No hepatosplenomegaly. Bowel sounds positive.  Musculoskeletal: no clubbing / cyanosis. No joint deformity upper and lower extremities. Good ROM, no contractures. Normal muscle tone.  Skin: no rashes, lesions, ulcers. No induration Neurologic: CN 2-12 grossly intact. Sensation intact, DTR normal. Strength 5/5 in all 4.  Psychiatric: Normal judgment and insight. Alert and oriented x 3. Normal mood.     Labs on Admission: I have personally reviewed following labs and imaging studies  CBC:  Recent Labs Lab 12/23/15 1445  WBC 10.6*  NEUTROABS 7.6  HGB 18.8*  HCT 54.8*  MCV 98.4  PLT 161   Basic Metabolic Panel:  Recent Labs Lab 12/23/15 1645  NA 138  K 4.2  CL 112*  CO2 20*  GLUCOSE 92  BUN 25*  CREATININE 2.09*  CALCIUM 9.6  MG 2.2   GFR: Estimated Creatinine Clearance: 25.9 mL/min (by C-G formula based on SCr of 2.09 mg/dL (H)). Liver Function  Tests:  Recent Labs Lab 12/23/15 1645  AST 22  ALT 9*  ALKPHOS 59  BILITOT 0.8  PROT 6.2*  ALBUMIN 3.7   No results for input(s): LIPASE, AMYLASE in the last 168 hours. No results for input(s): AMMONIA in the last 168 hours. Coagulation Profile:  Recent Labs Lab 12/23/15 1445  INR 1.03   Cardiac Enzymes: No results for input(s): CKTOTAL, CKMB, CKMBINDEX, TROPONINI in the last 168 hours. BNP (last 3 results) No results for input(s): PROBNP in the last 8760 hours. HbA1C: No results for input(s): HGBA1C in the last 72 hours. CBG: No results for input(s): GLUCAP in the last 168 hours. Lipid Profile: No results for input(s): CHOL, HDL, LDLCALC, TRIG, CHOLHDL, LDLDIRECT  in the last 72 hours. Thyroid Function Tests: No results for input(s): TSH, T4TOTAL, FREET4, T3FREE, THYROIDAB in the last 72 hours. Anemia Panel: No results for input(s): VITAMINB12, FOLATE, FERRITIN, TIBC, IRON, RETICCTPCT in the last 72 hours. Urine analysis: No results found for: COLORURINE, APPEARANCEUR, LABSPEC, PHURINE, GLUCOSEU, HGBUR, BILIRUBINUR, KETONESUR, PROTEINUR, UROBILINOGEN, NITRITE, LEUKOCYTESUR Sepsis Labs: Invalid input(s): PROCALCITONIN, LACTICIDVEN No results found for this or any previous visit (from the past 240 hour(s)).   Radiological Exams on Admission: Ct Angio Chest/abd/pel For Dissection W And/or W/wo  Result Date: 12/23/2015 CLINICAL DATA:  Abdominal aortic aneurysm with worsening back pain. EXAM: CT ANGIOGRAPHY CHEST, ABDOMEN AND PELVIS TECHNIQUE: Multidetector CT imaging through the chest, abdomen and pelvis was performed using the standard protocol during bolus administration of intravenous contrast. Multiplanar reconstructed images and MIPs were obtained and reviewed to evaluate the vascular anatomy. CONTRAST:  100 cc of Isovue 370 IV COMPARISON:  MRI from 12/23/2015, 08/29/2007 ultrasound in FINDINGS: CTA CHEST FINDINGS Cardiovascular: Satisfactory opacification of the  pulmonary arteries to the segmental level. No evidence of pulmonary embolism. The heart is enlarged. No pericardial effusion. There is three-vessel coronary arteriosclerosis. No thoracic aortic aneurysm nor dissection. Mediastinum/Nodes: No enlarged mediastinal, hilar, or axillary lymph nodes. Thyroid gland, trachea, and esophagus demonstrate no significant findings. Lungs/Pleura: Bilateral paraseptal and centrilobular emphysema with superimposed ground-glass opacities which may represent sequela mild pulmonary edema. Musculoskeletal: Anterior compression of T7 which appears chronic without evidence of paraspinal hematoma or retropulsion. Degenerative disc disease from T1 through T5 and T7 through T11. Review of the MIP images confirms the above findings. CTA ABDOMEN AND PELVIS FINDINGS VASCULAR Aorta: There is a fusiform infrarenal abdominal aortic aneurysm measuring up to 8.1 cm transverse. The proximal neck measures 2.2 cm in caliber and is approximately 3.5 cm from the left renal artery origin. The distal aspect of the fusiform aortic aneurysm extends to the aortic bifurcation were there is an ectatic appearance of both common iliac arteries, on the right measuring up to 1.9 cm and on the left, appearing more aneurysmal measuring 2.9 cm with mild circumferential soft plaque. The external iliac artery on the right measures 0.6 cm and on the left 0.8 cm and are patent. No evidence of abdominal aortic leak. No retroperitoneal fluid is seen. Along the left lateral soft plaque are developing calcifications which account for the hyperdensity seen. No communication with intraluminal contrast to suggested an ulcerated plaque. There is a small thrombosed 1.5 cm aneurysm of what appears to be a lumbar artery branch, series 501 image 170. Celiac: Patent without evidence of aneurysm, dissection, vasculitis or significant stenosis. Atherosclerosis is noted at the origin. SMA: Atherosclerosis at the origin of the SMA. No  dissection or aneurysm. Renals: Single renal arteries to both kidneys, attenuated on the right measuring 4 mm in caliber and 9 mm on the left. Small webs are noted in the proximal renal arteries. IMA: Patent without evidence of aneurysm, dissection, vasculitis or significant stenosis. Inflow: Patent without evidence of aneurysm, dissection, vasculitis or significant stenosis.Atherosclerosis of the descending thoracic aorta without aneurysm. Veins: No obvious venous abnormality within the limitations of this arterial phase study. Review of the MIP images confirms the above findings. NON-VASCULAR Hepatobiliary: There are too numerous to count hypodensities within the liver statistically consistent with cysts and/or hemangiomata. There is a granuloma in the right hepatic lobe. The gallbladder is not distended but contains several calculi. No wall thickening is noted. Pancreas: Fatty atrophy of the pancreas without ductal dilatation or focal  mass. Spleen: The spleen is not enlarged. Adrenals/Urinary Tract: Normal bilateral adrenal glands. Right worse than left renal cortical atrophy with bilateral simple and complex renal cysts. Extrarenal pelves are seen bilaterally. Urinary bladder is distended without acute abnormalities. Stomach/Bowel: Normal bowel rotation. No acute inflammation. There is colonic diverticulosis without acute diverticulitis. Lymphatic: No lymphadenopathy. Reproductive: Prostate appears unremarkable. Other: No ascites or pneumoperitoneum. Musculoskeletal: No acute osseous abnormality.  Lumbar spondylosis. Review of the MIP images confirms the above findings. IMPRESSION: 1. Infrarenal fusiform abdominal aortic aneurysm measuring 8 cm transverse without evidence of acute intramural hematoma or aortic leak. Developing calcifications seen within the soft plaque along the left lateral aspect of this aneurysm. The aneurysm is 3.5 cm from the renal arteries it measures approximately 2.2 cm in caliber. The  aneurysm terminates at the aortic bifurcation. There is ectasia of the right common iliac artery to 1.9 cm in and more aneurysmal dilatation of the left common iliac artery to 2.9 cm with plaque noted. The external iliac arteries are patent measuring 0.6 cm on the right and 0.8 cm on the left. 2. Numerous hepatic and bilateral renal hypodensities consistent with cysts. Atrophic appearing kidneys bilaterally. 3. No pulmonary embolus or thoracic aortic aneurysm. No aortic dissection. All 4.  Pulmonary emphysema. 5. No acute thoracic abnormality. Chronic appearing T7 compression. Critical Value/emergent results were called by telephone at the time of interpretation on 12/23/2015 at 5:13 pm to Dr. Alvira Monday , who verbally acknowledged these results and earlier to Dr. [name] of Vascular Surgery. Electronically Signed   By: Tollie Eth M.D.   On: 12/23/2015 17:14    EKG: Independently reviewed. sinus  Assessment/Plan Active Problems:   Chronic kidney disease (CKD), stage IV (severe) (HCC)   AAA (abdominal aortic aneurysm) (HCC)   AAA -seen by vascular- Dr. [name] Plan per vascular:        There is no evidence of rupture on the CTA despite the MRI findings.  There is NO indicate for emergent repair of this large AAA.  Due to his CKD IV and contrast load needed for the CTA, I recommend admission to Hospitalist service for IV hydration and observation for possible contrast induced nephropathy leading to ARF.  While admitted, would get Cardiac consultation to expedite preop evaluation.  Will get the CTA reviewed with my [employer] rep on Monday as I suspect his anatomy is outside of the IFU for the C3 device.  I still suspect it can be repaired with such.  Would subsequently plan on proceeding with EVAR the week of 4th of Dec.  Assuming preop Cardiology               evaluation completed, to facilitate preop Anes evaluation. -echo ordered-- call cardiology for consult in AM  CKD-stage IV -follows  with Dr. [name] -has fistula that is ready for possible dialysis -IVF as patient got a contrast load with CTA -trend BMP  HLD -crestor   DVT prophylaxis: SCDs Code Status: full Family Communication: patient and wife at bedside Disposition Plan: 1-2 days Consults called: vascular-- saw patient inER Admission status: obs   Pierra Skora Juanetta Gosling DO [facility] Pager [phone]  If 7PM-7AM, please contact night-coverage [email or web address] Password [email or web address]  12/23/2015, 7:30 PM

## 2015-12-23 NOTE — ED Notes (Signed)
MD Plunkett at the bedside  

## 2015-12-23 NOTE — ED Notes (Signed)
PT returned from CT

## 2015-12-23 NOTE — ED Notes (Signed)
MD PLunkett at the bedside

## 2015-12-23 NOTE — ED Notes (Signed)
Called CT to get patient.  

## 2015-12-23 NOTE — ED Provider Notes (Signed)
[facility]-EMERGENCY DEPT Provider Note   CSN: [id number] Arrival date & time: 12/23/15  1429     History   Chief Complaint Chief Complaint  Patient presents with  . Thoracic Aortic Aneurysm    HPI Kenneth BRAMEL Sr. is a 81 y.o. male.  HPI   81 year old male with a history of hypertension, remote hx of smoking, back pain x 2 days, CK D stage IV with graft placed 06/28/2015 by Dr. [name], presents with concern for incidental finding of 7.9 cm abdominal aortic aneurysm with possible acute hemorrhage in the posterior wall and small amount of extraluminal fluid seen on MRI of the back. Patient reported years of back pain, however some worsening over the weekend for which he saw his PCP at [facility] family medicine who had ordered MRI.  Reports back pain x 2 years with worsening this weekend, reports it is in the lower back and radiates down to both legs, is worse with movement.  Reports pain is worse with movement  Past Medical History:  Diagnosis Date  . Arteriosclerotic cardiovascular disease   . Chronic kidney disease   . Chronic kidney disease, stage IV (severe) (HCC)   . DJD (degenerative joint disease)   . Hypertension   . Hypertensive renal disease   . Renal tubular acidosis   . Secondary hyperparathyroidism [facility])     Patient Active Problem List   Diagnosis Date Noted  . AAA (abdominal aortic aneurysm) (HCC) 12/23/2015  . Chronic kidney disease (CKD), stage IV (severe) (HCC) 06/10/2015    Past Surgical History:  Procedure Laterality Date  . APPENDECTOMY    . AV FISTULA PLACEMENT Right 06/28/2015   Procedure: RIGHT BRACHIOCEPHALIC ARTERIOVENOUS (AV) FISTULA CREATION;  Surgeon: Fransisco Hertz, MD;  Location: [facility] OR;  Service: Vascular;  Laterality: Right;  . HIP FRACTURE SURGERY Left   . MULTIPLE TOOTH EXTRACTIONS    . TONSILLECTOMY         Home Medications    Prior to Admission medications   Medication Sig Start Date End Date Taking? Authorizing Provider  allopurinol  (ZYLOPRIM) 300 MG tablet Take 300 mg by mouth daily.   Yes Historical Provider, MD  aspirin EC 81 MG tablet Take 81 mg by mouth daily.   Yes Historical Provider, MD  B Complex-C-Folic Acid (RENA-VITE RX) 1 MG TABS Take 1 mg by mouth daily.   Yes Historical Provider, MD  calcium acetate (PHOSLO) 667 MG capsule Take 667 mg by mouth 2 (two) times daily with a meal.   Yes Historical Provider, MD  fluticasone (FLONASE) 50 MCG/ACT nasal spray Place 1 spray into both nostrils daily as needed. 06/15/15  Yes Historical Provider, MD  loratadine (CLARITIN) 10 MG tablet Take 10 mg by mouth at bedtime.   Yes Historical Provider, MD  metoprolol succinate (TOPROL-XL) 50 MG 24 hr tablet Take 50 mg by mouth at bedtime. Take with or immediately following a meal.   Yes Historical Provider, MD  rosuvastatin (CRESTOR) 20 MG tablet Take 20 mg by mouth daily. 12/20/15  Yes Historical Provider, MD  sodium bicarbonate 650 MG tablet Take 1,300 mg by mouth 2 (two) times daily.   Yes Historical Provider, MD  VENTOLIN HFA 108 (90 Base) MCG/ACT inhaler Inhale 1 puff into the lungs every 4 (four) hours as needed for wheezing. 12/20/15  Yes Historical Provider, MD  oxyCODONE-acetaminophen (ROXICET) 5-325 MG tablet Take 1-2 tablets by mouth every 6 (six) hours as needed for moderate pain. Patient not taking: Reported on 12/23/2015 06/28/15  Fransisco HertzBrian L Chen, MD    Family History Family History  Problem Relation Age of Onset  . Diabetes Mother   . Stroke Father   . AAA (abdominal aortic aneurysm) Father     Social History Social History  Substance Use Topics  . Smoking status: Former Smoker    Types: Cigarettes  . Smokeless tobacco: Current User    Types: Chew     Comment: " years ago i quit smoking cigarettes"  . Alcohol use No     Allergies   Phenobarbital and Prednisone   Review of Systems Review of Systems  Constitutional: Negative for fever.  HENT: Negative for sore throat.   Eyes: Negative for visual  disturbance.  Respiratory: Negative for shortness of breath.   Cardiovascular: Negative for chest pain.  Gastrointestinal: Negative for abdominal pain (occasional left sided "gurgling" but none today), constipation, diarrhea, nausea and vomiting.  Genitourinary: Negative for difficulty urinating and dysuria.  Musculoskeletal: Positive for back pain. Negative for neck stiffness.  Skin: Negative for rash.  Neurological: Positive for weakness (reports bilateral leg weakness with exertion). Negative for syncope, light-headedness, numbness and headaches.     Physical Exam Updated Vital Signs BP (!) 142/83 (BP Location: Left Arm)   Pulse 76   Temp 98.2 F (36.8 C) (Oral)   Resp 18   Ht 5\' 6"  (1.676 m)   Wt 159 lb 14.4 oz (72.5 kg)   SpO2 90%   BMI 25.81 kg/m   Physical Exam  Constitutional: He is oriented to person, place, and time. He appears well-developed and well-nourished. No distress.  HENT:  Head: Normocephalic and atraumatic.  Eyes: Conjunctivae and EOM are normal.  Neck: Normal range of motion.  Cardiovascular: Normal rate, regular rhythm, normal heart sounds and intact distal pulses.  Exam reveals no gallop and no friction rub.   No murmur heard. 2+ distal pulses in 4 extremities  Pulmonary/Chest: Effort normal and breath sounds normal. No respiratory distress. He has no wheezes. He has no rales.  Abdominal: Soft. He exhibits pulsatile midline mass. He exhibits no distension. There is no tenderness. There is no guarding.  Musculoskeletal: He exhibits no edema.  Neurological: He is alert and oriented to person, place, and time.  Skin: Skin is warm and dry. He is not diaphoretic.  Nursing note and vitals reviewed.    ED Treatments / Results  Labs (all labs ordered are listed, but only abnormal results are displayed) Labs Reviewed  CBC WITH DIFFERENTIAL/PLATELET - Abnormal; Notable for the following:       Result Value   WBC 10.6 (*)    Hemoglobin 18.8 (*)    HCT  54.8 (*)    All other components within normal limits  COMPREHENSIVE METABOLIC PANEL - Abnormal; Notable for the following:    Chloride 112 (*)    CO2 20 (*)    BUN 25 (*)    Creatinine, Ser 2.09 (*)    Total Protein 6.2 (*)    ALT 9 (*)    GFR calc non Af Amer 28 (*)    GFR calc Af Amer 33 (*)    All other components within normal limits  PROTIME-INR  MAGNESIUM  BASIC METABOLIC PANEL  CBC  TYPE AND SCREEN  ABO/RH    EKG  EKG Interpretation  Date/Time:  Friday December 23 2015 14:42:37 EST Ventricular Rate:  93 PR Interval:    QRS Duration: 93 QT Interval:  375 QTC Calculation: 467 R Axis:   -72 Text  Interpretation:  Sinus rhythm Borderline prolonged PR interval Probable left atrial enlargement Left anterior fascicular block Consider anterior infarct Baseline wander in lead(s) V2 V6 No significant change since last tracing Confirmed by [facility]  MD, Alphonzo LemmingsWHITNEY ([phone]) on 12/23/2015 2:48:48 PM       Radiology Ct Angio Chest/abd/pel For Dissection W And/or W/wo  Result Date: 12/23/2015 CLINICAL DATA:  Abdominal aortic aneurysm with worsening back pain. EXAM: CT ANGIOGRAPHY CHEST, ABDOMEN AND PELVIS TECHNIQUE: Multidetector CT imaging through the chest, abdomen and pelvis was performed using the standard protocol during bolus administration of intravenous contrast. Multiplanar reconstructed images and MIPs were obtained and reviewed to evaluate the vascular anatomy. CONTRAST:  100 cc of Isovue 370 IV COMPARISON:  MRI from 12/23/2015, 08/29/2007 ultrasound in FINDINGS: CTA CHEST FINDINGS Cardiovascular: Satisfactory opacification of the pulmonary arteries to the segmental level. No evidence of pulmonary embolism. The heart is enlarged. No pericardial effusion. There is three-vessel coronary arteriosclerosis. No thoracic aortic aneurysm nor dissection. Mediastinum/Nodes: No enlarged mediastinal, hilar, or axillary lymph nodes. Thyroid gland, trachea, and esophagus demonstrate no  significant findings. Lungs/Pleura: Bilateral paraseptal and centrilobular emphysema with superimposed ground-glass opacities which may represent sequela mild pulmonary edema. Musculoskeletal: Anterior compression of T7 which appears chronic without evidence of paraspinal hematoma or retropulsion. Degenerative disc disease from T1 through T5 and T7 through T11. Review of the MIP images confirms the above findings. CTA ABDOMEN AND PELVIS FINDINGS VASCULAR Aorta: There is a fusiform infrarenal abdominal aortic aneurysm measuring up to 8.1 cm transverse. The proximal neck measures 2.2 cm in caliber and is approximately 3.5 cm from the left renal artery origin. The distal aspect of the fusiform aortic aneurysm extends to the aortic bifurcation were there is an ectatic appearance of both common iliac arteries, on the right measuring up to 1.9 cm and on the left, appearing more aneurysmal measuring 2.9 cm with mild circumferential soft plaque. The external iliac artery on the right measures 0.6 cm and on the left 0.8 cm and are patent. No evidence of abdominal aortic leak. No retroperitoneal fluid is seen. Along the left lateral soft plaque are developing calcifications which account for the hyperdensity seen. No communication with intraluminal contrast to suggested an ulcerated plaque. There is a small thrombosed 1.5 cm aneurysm of what appears to be a lumbar artery branch, series 501 image 170. Celiac: Patent without evidence of aneurysm, dissection, vasculitis or significant stenosis. Atherosclerosis is noted at the origin. SMA: Atherosclerosis at the origin of the SMA. No dissection or aneurysm. Renals: Single renal arteries to both kidneys, attenuated on the right measuring 4 mm in caliber and 9 mm on the left. Small webs are noted in the proximal renal arteries. IMA: Patent without evidence of aneurysm, dissection, vasculitis or significant stenosis. Inflow: Patent without evidence of aneurysm, dissection,  vasculitis or significant stenosis.Atherosclerosis of the descending thoracic aorta without aneurysm. Veins: No obvious venous abnormality within the limitations of this arterial phase study. Review of the MIP images confirms the above findings. NON-VASCULAR Hepatobiliary: There are too numerous to count hypodensities within the liver statistically consistent with cysts and/or hemangiomata. There is a granuloma in the right hepatic lobe. The gallbladder is not distended but contains several calculi. No wall thickening is noted. Pancreas: Fatty atrophy of the pancreas without ductal dilatation or focal mass. Spleen: The spleen is not enlarged. Adrenals/Urinary Tract: Normal bilateral adrenal glands. Right worse than left renal cortical atrophy with bilateral simple and complex renal cysts. Extrarenal pelves are seen bilaterally. Urinary bladder  is distended without acute abnormalities. Stomach/Bowel: Normal bowel rotation. No acute inflammation. There is colonic diverticulosis without acute diverticulitis. Lymphatic: No lymphadenopathy. Reproductive: Prostate appears unremarkable. Other: No ascites or pneumoperitoneum. Musculoskeletal: No acute osseous abnormality.  Lumbar spondylosis. Review of the MIP images confirms the above findings. IMPRESSION: 1. Infrarenal fusiform abdominal aortic aneurysm measuring 8 cm transverse without evidence of acute intramural hematoma or aortic leak. Developing calcifications seen within the soft plaque along the left lateral aspect of this aneurysm. The aneurysm is 3.5 cm from the renal arteries it measures approximately 2.2 cm in caliber. The aneurysm terminates at the aortic bifurcation. There is ectasia of the right common iliac artery to 1.9 cm in and more aneurysmal dilatation of the left common iliac artery to 2.9 cm with plaque noted. The external iliac arteries are patent measuring 0.6 cm on the right and 0.8 cm on the left. 2. Numerous hepatic and bilateral renal  hypodensities consistent with cysts. Atrophic appearing kidneys bilaterally. 3. No pulmonary embolus or thoracic aortic aneurysm. No aortic dissection. All 4.  Pulmonary emphysema. 5. No acute thoracic abnormality. Chronic appearing T7 compression. Critical Value/emergent results were called by telephone at the time of interpretation on 12/23/2015 at 5:13 pm to Dr. Alvira Monday , who verbally acknowledged these results and earlier to Dr. [name] of Vascular Surgery. Electronically Signed   By: Tollie Eth M.D.   On: 12/23/2015 17:14    Procedures Procedures (including critical care time)  Medications Ordered in ED Medications  rosuvastatin (CRESTOR) tablet 20 mg (20 mg Oral Not Given 12/23/15 2030)  fluticasone (FLONASE) 50 MCG/ACT nasal spray 1 spray (not administered)  aspirin EC tablet 81 mg (81 mg Oral Not Given 12/23/15 2030)  loratadine (CLARITIN) tablet 10 mg (10 mg Oral Given 12/23/15 2034)  allopurinol (ZYLOPRIM) tablet 300 mg (300 mg Oral Not Given 12/23/15 2030)  calcium acetate (PHOSLO) capsule 667 mg (not administered)  metoprolol succinate (TOPROL-XL) 24 hr tablet 50 mg (50 mg Oral Given 12/23/15 2034)  sodium bicarbonate tablet 1,300 mg (1,300 mg Oral Given 12/23/15 2034)  acetaminophen (TYLENOL) tablet 650 mg (not administered)    Or  acetaminophen (TYLENOL) suppository 650 mg (not administered)  0.9 %  sodium chloride infusion ( Intravenous New Bag/Given 12/23/15 2038)  multivitamin (RENA-VIT) tablet 1 tablet (1 tablet Oral Not Given 12/23/15 2200)  albuterol (PROVENTIL) (2.5 MG/3ML) 0.083% nebulizer solution 2.5 mg (not administered)  iopamidol (ISOVUE-370) 76 % injection (100 mLs  Contrast Given 12/23/15 1554)  sodium chloride 0.9 % bolus 1,000 mL (0 mLs Intravenous Stopped 12/23/15 1825)     Initial Impression / Assessment and Plan / ED Course  I have reviewed the triage vital signs and the nursing notes.  Pertinent labs & imaging results that were available  during my care of the patient were reviewed by me and considered in my medical decision making (see chart for details).  Clinical Course    81 year old male with a history of hypertension, remote hx of smoking, back pain x 2 days, CK D stage IV with graft placed 06/28/2015 by Dr. [name], presents with concern for incidental finding of 7.9 cm abdominal aortic aneurysm with possible acute hemorrhage in the posterior wall and small amount of extraluminal fluid seen on MRI of the back. Patient reported years of back pain, however some worsening over the weekend for which he saw his PCP at [facility] family medicine who had ordered MRI.  Patient is hemodynamically stable in the emergency department. Vascular surgery, Dr.  [name] was consulted, and recommends CT angiogram chest, abdomen and pelvis. Discussed risks and benefits of CT with contrast with patient, including the possibility of worsening renal failure requiring dialysis, and risk of ruptured aneurysm, and patient would like to pursue CT angio.   CT angio performed showing 8cm aneurysm however no signs of rupture.  Dr. [name] came to bedside and evaluated patient. Discussed plan to admit pt for IV hydration, monitoring of renal function in setting of contrast load, initiation of preop evaluation with cardiology for plan for operative repair of aneurysm.    Final Clinical Impressions(s) / ED Diagnoses   Final diagnoses:  Back pain  Abdominal aortic aneurysm (AAA) without rupture (HCC)  Stage 4 chronic kidney disease [facility])    New Prescriptions Current Discharge Medication List       Alvira Monday, MD 12/24/15 [phone]

## 2015-12-23 NOTE — ED Notes (Signed)
Pt given blankets and ice chips. 

## 2015-12-23 NOTE — Progress Notes (Signed)
Pt arrived to 2W from ED.  Telemetry applied and CCMD notified x 2.  Pt oriented to room including call light and telephone.  Plan of care discussed with Pt and wife, both indicate understanding.  Will cont to monitor.

## 2015-12-24 ENCOUNTER — Other Ambulatory Visit (HOSPITAL_COMMUNITY): Payer: Medicare Other

## 2015-12-24 DIAGNOSIS — R0609 Other forms of dyspnea: Secondary | ICD-10-CM | POA: Diagnosis not present

## 2015-12-24 DIAGNOSIS — Z01810 Encounter for preprocedural cardiovascular examination: Secondary | ICD-10-CM | POA: Diagnosis not present

## 2015-12-24 DIAGNOSIS — M109 Gout, unspecified: Secondary | ICD-10-CM | POA: Diagnosis not present

## 2015-12-24 DIAGNOSIS — I713 Abdominal aortic aneurysm, ruptured: Secondary | ICD-10-CM | POA: Diagnosis not present

## 2015-12-24 DIAGNOSIS — I714 Abdominal aortic aneurysm, without rupture: Secondary | ICD-10-CM | POA: Diagnosis not present

## 2015-12-24 DIAGNOSIS — N184 Chronic kidney disease, stage 4 (severe): Secondary | ICD-10-CM | POA: Diagnosis not present

## 2015-12-24 DIAGNOSIS — E785 Hyperlipidemia, unspecified: Secondary | ICD-10-CM

## 2015-12-24 LAB — CBC
HCT: 45.6 % (ref 39.0–52.0)
HEMOGLOBIN: 15.3 g/dL (ref 13.0–17.0)
MCH: 33 pg (ref 26.0–34.0)
MCHC: 33.6 g/dL (ref 30.0–36.0)
MCV: 98.5 fL (ref 78.0–100.0)
Platelets: 125 10*3/uL — ABNORMAL LOW (ref 150–400)
RBC: 4.63 MIL/uL (ref 4.22–5.81)
RDW: 15.3 % (ref 11.5–15.5)
WBC: 7.1 10*3/uL (ref 4.0–10.5)

## 2015-12-24 LAB — BASIC METABOLIC PANEL
ANION GAP: 7 (ref 5–15)
BUN: 23 mg/dL — ABNORMAL HIGH (ref 6–20)
CHLORIDE: 114 mmol/L — AB (ref 101–111)
CO2: 19 mmol/L — AB (ref 22–32)
Calcium: 8.8 mg/dL — ABNORMAL LOW (ref 8.9–10.3)
Creatinine, Ser: 1.97 mg/dL — ABNORMAL HIGH (ref 0.61–1.24)
GFR calc non Af Amer: 31 mL/min — ABNORMAL LOW (ref >60)
GFR, EST AFRICAN AMERICAN: 35 mL/min — AB (ref >60)
Glucose, Bld: 91 mg/dL (ref 65–99)
POTASSIUM: 4.5 mmol/L (ref 3.5–5.1)
SODIUM: 140 mmol/L (ref 135–145)

## 2015-12-24 NOTE — Progress Notes (Addendum)
Progress Note    12/24/2015 9:19 AM Hospital Day 1  Subjective:  No complaints.  Wants to go home.  No abdominal pain.  Afebrile HR  110's-140's systolic HR 60's-70's NSR 90% RA  Vitals:   12/24/15 0600 12/24/15 0857  BP: 112/68 119/63  Pulse: 68 71  Resp: 15 16  Temp: 98.1 F (36.7 C) 97.5 F (36.4 C)    Physical Exam: Lungs:  Non labored Abdomen:  Soft, NT/ND   CBC    Component Value Date/Time   WBC 7.1 12/24/2015 0311   RBC 4.63 12/24/2015 0311   HGB 15.3 12/24/2015 0311   HCT 45.6 12/24/2015 0311   PLT 125 (L) 12/24/2015 0311   MCV 98.5 12/24/2015 0311   MCH 33.0 12/24/2015 0311   MCHC 33.6 12/24/2015 0311   RDW 15.3 12/24/2015 0311   LYMPHSABS 1.9 12/23/2015 1445   MONOABS 0.8 12/23/2015 1445   EOSABS 0.3 12/23/2015 1445   BASOSABS 0.1 12/23/2015 1445    BMET    Component Value Date/Time   NA 140 12/24/2015 0311   K 4.5 12/24/2015 0311   CL 114 (H) 12/24/2015 0311   CO2 19 (L) 12/24/2015 0311   GLUCOSE 91 12/24/2015 0311   BUN 23 (H) 12/24/2015 0311   CREATININE 1.97 (H) 12/24/2015 0311   CALCIUM 8.8 (L) 12/24/2015 0311   GFRNONAA 31 (L) 12/24/2015 0311   GFRAA 35 (L) 12/24/2015 0311    INR    Component Value Date/Time   INR 1.03 12/23/2015 1445     Intake/Output Summary (Last 24 hours) at 12/24/15 0919 Last data filed at 12/24/15 0737  Gross per 24 hour  Intake             1000 ml  Output              400 ml  Net              600 ml     Assessment/Plan:  81 y.o. male with large non-ruptured AAA Hospital Day 1  -pt doing well this am.  He does not have any abdominal pain or sharp back pain. -his creatinine is improved with IV hydration. -I spoke with cardiology this am for consult for pre-op clearance evaluation. -hopefully pt will be able to discharge home today and f/u for surgery in the next 1-2 weeks.   Doreatha MassedSamantha Rhyne, PA-C [facility] [phone] 12/24/2015 9:19 AM   Addendum  I have  independently interviewed and examined the patient, and I agree with the physician assistant's findings.  No evidence of CIN.  Get inpt cardiac consult to expedite evaluation for possible EVAR vs open AAA repair in next 2 weeks.  Once cardiac evaluation complete, ok to go home  Leonides SakeBrian Chen, MD, [facility] [place] Office: [phone] Pager: [phone]  12/24/2015, 9:51 AM  Addendum  In my discussions with Dr. Graciela HusbandsKlein, a more extensive cardiology evaluation appears to be necessary.  This will extend his hospitalization.  He appears to be risk stratifying toward high risk.  Subsequently, he may not be be an open aortic candidate.  If his anatomy turns out to be compatible with EVAR, may try to get him on the schedule for Tuesday if further cardiac optimization is not necessary.  - his annual rupture risk is >50%, so if possible would get him repaired within the next 1-2 weeks   Leonides SakeBrian Chen, MD, FACS [facility] [place] Office: [phone] Pager: [phone]  12/24/2015, 6:14 PM

## 2015-12-24 NOTE — Progress Notes (Signed)
PROGRESS NOTE    Kenneth MinaDavid G Kotara Sr.  WUJ:[id number] DOB: 02/12/1934 DOA: 12/23/2015 PCP: Simone CuriaLEE,[name], MD  Brief Narrative: Kenneth Pyoavid G Shackett Sr. is a 81 y.o. male with medical history significant of chronic kidney disease stage IV, hypertension, hyperlipidemia. Patient was having back pain and his PCP sent him for an MRI of his back. Patient had returned home after the MRI when he was called by radiology and told to come to the ER for an incidental finding of a thoracic aortic aneurysm. Patient's back pain previously had been associated with degenerative disc disease with radicular symptoms in his legs. Patient had a CTA in the ER of the chest abdomen and pelvis. He was found to have a large infrarenal AAA that may be compatible for EVAR repair. He was seen by Dr. [name] in the ER suggested that patient be watched in the hospital after receiving IV contrast with chronic kidney disease stage IV. Per patient his normal GFR ranges between 15-20. He follows with [name] on a regular basis.  Per vascular they would like a Cardiology consult to get a preop evaluation. Tentatively surgery is planned for December 4. Cardiology came to evaluate the patient and are concerned that his dyspnea may have a cardiac component as opposed to just his chronic Emphysemetatous Disease and Right Heart Failure may be may be related to Pulmonary HTN from his pulmonary Disease or 2/2 to Left-Sided Disease so is undergoing an Echocardiogram and possible Myoview Stress Test.  Assessment & Plan:   Active Problems:   Chronic kidney disease (CKD), stage IV (severe) (HCC)   AAA (abdominal aortic aneurysm) (HCC)  AAA -Seen by Vascular- Dr. [name] -Per Dr. Nicky Pughhen's note There is no evidence of rupture on the CTA despite the MRI findings.  +There is NO indicate for emergent repair of this large AAA.  +Due to his CKD IV and contrast load needed for the CTA, I recommend admission to Hospitalist service for IV hydration and observation  for possible contrast induced nephropathy leading to ARF.  +While admitted, would get Cardiac consultation to expedite preop evaluation.  +Will get the CTA reviewed with my [employer] rep on Monday as I suspect his anatomy is outside of the IFU for the C3 device. I still suspect it can be repaired with such.  +Would subsequently plan on proceeding with EVAR the week of 4th of Dec.  -Cardiology Evaluated and concerned about patient's dyspnea and may have a cardiac component as opposed to just chronic Emphysemetous disease and Right Heart Failure (evidenced by JVP on Cardiology Exam) may be related to Pulmonary HTN or Left Sided Heart Disease -ECHOcardiogram ordered and pending to be done -Possible Myoview Stress Test per Cardiology; Will defer to their recommendations   CKD-stage IV -follows with [name] -Has fistula that is ready for possible dialysis -IVF as patient got a contrast load with CTA; Was receiving NS at 75 mL/hr. Will Decrease to 30 mL/hr as patient is now eating -Patients BUN/Cr went from 25/2.09 -> 23/1.97 -Repeat BMP in AM  HLD -C/w Rosovastatin 20 mg po Daily   GOUT -C/w Allopurinol 300 mg po Daily  DVT prophylaxis: SCD's Code Status: FULL Family Communication: Discussed Plan of Care with Wife Disposition Plan: Pending Cardiology Recc's  Consultants:   Vascular Surgery  Cardiology  Procedures: ECHOcardiogram  Antimicrobials: None  Subjective: Seen and examined at bedside and doing well. Stated his back still hurt but not as bad. No CP/SOB/N/V or Lightheadedness or dizziness. No other concerns or  complaints at this time.  Objective: Vitals:   12/23/15 2029 12/24/15 0600 12/24/15 0857 12/24/15 1244  BP: (!) 142/83 112/68 119/63 133/72  Pulse: 76 68 71 65  Resp: 18 15 16 16   Temp: 98.2 F (36.8 C) 98.1 F (36.7 C) 97.5 F (36.4 C) 98 F (36.7 C)  TempSrc: Oral Oral Oral Oral  SpO2: 90% 90% 90% 92%  Weight: 72.5 kg (159 lb 14.4 oz) 74.2 kg (163 lb  9.6 oz)    Height: 5\' 6"  (1.676 m)       Intake/Output Summary (Last 24 hours) at 12/24/15 1809 Last data filed at 12/24/15 1240  Gross per 24 hour  Intake             1000 ml  Output              750 ml  Net              250 ml   Filed Weights   12/23/15 1448 12/23/15 2029 12/24/15 0600  Weight: 73.5 kg (162 lb) 72.5 kg (159 lb 14.4 oz) 74.2 kg (163 lb 9.6 oz)    Examination: Physical Exam:  Constitutional: WN/WD, NAD and appears calm and comfortable Eyes: PERRL, lids and conjunctivae normal, sclerae anicteric  ENMT: External Ears, Nose appear normal. Grossly normal hearing. Neck: Appears normal, supple, no cervical masses, normal ROM, no appreciable thyromegaly Respiratory: Clear to auscultation bilaterally, no wheezing, rales, rhonchi or crackles. Normal respiratory effort and patient is not tachypenic. No accessory muscle use.  Cardiovascular: RRR, Slight 2/6 Sysotlic Murmur. No rubs / gallops. S1 and S2 auscultated. No extremity edema. Abdomen: Soft, non-tender, non-distended. No masses palpated. No appreciable hepatosplenomegaly. Bowel sounds positive x4.  GU: Deferred. Musculoskeletal: No clubbing / cyanosis of digits/nails. No joint deformity upper and lower extremities. No contractures.  Skin: No rashes, lesions, ulcers. No induration; Warm and dry.  Neurologic: CN 2-12 grossly intact with no focal deficits. Sensation intact in all 4 Extremities. Romberg sign cerebellar reflexes not assessed.  Psychiatric: Normal judgment and insight. Alert and oriented x 3. Normal mood and appropriate affect.   Data Reviewed: I have personally reviewed following labs and imaging studies  CBC:  Recent Labs Lab 12/23/15 1445 12/24/15 0311  WBC 10.6* 7.1  NEUTROABS 7.6  --   HGB 18.8* 15.3  HCT 54.8* 45.6  MCV 98.4 98.5  PLT 161 125*   Basic Metabolic Panel:  Recent Labs Lab 12/23/15 1645 12/24/15 0311  NA 138 140  K 4.2 4.5  CL 112* 114*  CO2 20* 19*  GLUCOSE 92 91    BUN 25* 23*  CREATININE 2.09* 1.97*  CALCIUM 9.6 8.8*  MG 2.2  --    GFR: Estimated Creatinine Clearance: 27.4 mL/min (by C-G formula based on SCr of 1.97 mg/dL (H)). Liver Function Tests:  Recent Labs Lab 12/23/15 1645  AST 22  ALT 9*  ALKPHOS 59  BILITOT 0.8  PROT 6.2*  ALBUMIN 3.7   No results for input(s): LIPASE, AMYLASE in the last 168 hours. No results for input(s): AMMONIA in the last 168 hours. Coagulation Profile:  Recent Labs Lab 12/23/15 1445  INR 1.03   Cardiac Enzymes: No results for input(s): CKTOTAL, CKMB, CKMBINDEX, TROPONINI in the last 168 hours. BNP (last 3 results) No results for input(s): PROBNP in the last 8760 hours. HbA1C: No results for input(s): HGBA1C in the last 72 hours. CBG: No results for input(s): GLUCAP in the last 168 hours. Lipid Profile: No results  for input(s): CHOL, HDL, LDLCALC, TRIG, CHOLHDL, LDLDIRECT in the last 72 hours. Thyroid Function Tests: No results for input(s): TSH, T4TOTAL, FREET4, T3FREE, THYROIDAB in the last 72 hours. Anemia Panel: No results for input(s): VITAMINB12, FOLATE, FERRITIN, TIBC, IRON, RETICCTPCT in the last 72 hours. Sepsis Labs: No results for input(s): PROCALCITON, LATICACIDVEN in the last 168 hours.  No results found for this or any previous visit (from the past 240 hour(s)).   Radiology Studies: Ct Angio Chest/abd/pel For Dissection W And/or W/wo  Result Date: 12/23/2015 CLINICAL DATA:  Abdominal aortic aneurysm with worsening back pain. EXAM: CT ANGIOGRAPHY CHEST, ABDOMEN AND PELVIS TECHNIQUE: Multidetector CT imaging through the chest, abdomen and pelvis was performed using the standard protocol during bolus administration of intravenous contrast. Multiplanar reconstructed images and MIPs were obtained and reviewed to evaluate the vascular anatomy. CONTRAST:  100 cc of Isovue 370 IV COMPARISON:  MRI from 12/23/2015, 08/29/2007 ultrasound in FINDINGS: CTA CHEST FINDINGS Cardiovascular:  Satisfactory opacification of the pulmonary arteries to the segmental level. No evidence of pulmonary embolism. The heart is enlarged. No pericardial effusion. There is three-vessel coronary arteriosclerosis. No thoracic aortic aneurysm nor dissection. Mediastinum/Nodes: No enlarged mediastinal, hilar, or axillary lymph nodes. Thyroid gland, trachea, and esophagus demonstrate no significant findings. Lungs/Pleura: Bilateral paraseptal and centrilobular emphysema with superimposed ground-glass opacities which may represent sequela mild pulmonary edema. Musculoskeletal: Anterior compression of T7 which appears chronic without evidence of paraspinal hematoma or retropulsion. Degenerative disc disease from T1 through T5 and T7 through T11. Review of the MIP images confirms the above findings. CTA ABDOMEN AND PELVIS FINDINGS VASCULAR Aorta: There is a fusiform infrarenal abdominal aortic aneurysm measuring up to 8.1 cm transverse. The proximal neck measures 2.2 cm in caliber and is approximately 3.5 cm from the left renal artery origin. The distal aspect of the fusiform aortic aneurysm extends to the aortic bifurcation were there is an ectatic appearance of both common iliac arteries, on the right measuring up to 1.9 cm and on the left, appearing more aneurysmal measuring 2.9 cm with mild circumferential soft plaque. The external iliac artery on the right measures 0.6 cm and on the left 0.8 cm and are patent. No evidence of abdominal aortic leak. No retroperitoneal fluid is seen. Along the left lateral soft plaque are developing calcifications which account for the hyperdensity seen. No communication with intraluminal contrast to suggested an ulcerated plaque. There is a small thrombosed 1.5 cm aneurysm of what appears to be a lumbar artery branch, series 501 image 170. Celiac: Patent without evidence of aneurysm, dissection, vasculitis or significant stenosis. Atherosclerosis is noted at the origin. SMA: Atherosclerosis  at the origin of the SMA. No dissection or aneurysm. Renals: Single renal arteries to both kidneys, attenuated on the right measuring 4 mm in caliber and 9 mm on the left. Small webs are noted in the proximal renal arteries. IMA: Patent without evidence of aneurysm, dissection, vasculitis or significant stenosis. Inflow: Patent without evidence of aneurysm, dissection, vasculitis or significant stenosis.Atherosclerosis of the descending thoracic aorta without aneurysm. Veins: No obvious venous abnormality within the limitations of this arterial phase study. Review of the MIP images confirms the above findings. NON-VASCULAR Hepatobiliary: There are too numerous to count hypodensities within the liver statistically consistent with cysts and/or hemangiomata. There is a granuloma in the right hepatic lobe. The gallbladder is not distended but contains several calculi. No wall thickening is noted. Pancreas: Fatty atrophy of the pancreas without ductal dilatation or focal mass. Spleen: The spleen  is not enlarged. Adrenals/Urinary Tract: Normal bilateral adrenal glands. Right worse than left renal cortical atrophy with bilateral simple and complex renal cysts. Extrarenal pelves are seen bilaterally. Urinary bladder is distended without acute abnormalities. Stomach/Bowel: Normal bowel rotation. No acute inflammation. There is colonic diverticulosis without acute diverticulitis. Lymphatic: No lymphadenopathy. Reproductive: Prostate appears unremarkable. Other: No ascites or pneumoperitoneum. Musculoskeletal: No acute osseous abnormality.  Lumbar spondylosis. Review of the MIP images confirms the above findings. IMPRESSION: 1. Infrarenal fusiform abdominal aortic aneurysm measuring 8 cm transverse without evidence of acute intramural hematoma or aortic leak. Developing calcifications seen within the soft plaque along the left lateral aspect of this aneurysm. The aneurysm is 3.5 cm from the renal arteries it measures  approximately 2.2 cm in caliber. The aneurysm terminates at the aortic bifurcation. There is ectasia of the right common iliac artery to 1.9 cm in and more aneurysmal dilatation of the left common iliac artery to 2.9 cm with plaque noted. The external iliac arteries are patent measuring 0.6 cm on the right and 0.8 cm on the left. 2. Numerous hepatic and bilateral renal hypodensities consistent with cysts. Atrophic appearing kidneys bilaterally. 3. No pulmonary embolus or thoracic aortic aneurysm. No aortic dissection. All 4.  Pulmonary emphysema. 5. No acute thoracic abnormality. Chronic appearing T7 compression. Critical Value/emergent results were called by telephone at the time of interpretation on 12/23/2015 at 5:13 pm to Dr. Alvira Monday , who verbally acknowledged these results and earlier to Dr. [name] of Vascular Surgery. Electronically Signed   By: Tollie Eth M.D.   On: 12/23/2015 17:14   Scheduled Meds: . allopurinol  300 mg Oral Daily  . aspirin EC  81 mg Oral Daily  . calcium acetate  667 mg Oral BID WC  . loratadine  10 mg Oral QHS  . metoprolol succinate  50 mg Oral QHS  . multivitamin  1 tablet Oral QHS  . rosuvastatin  20 mg Oral Daily  . sodium bicarbonate  1,300 mg Oral BID   Continuous Infusions: . sodium chloride 75 mL/hr at 12/23/15 2038    LOS: 0 days   Merlene Laughter, DO [facility] Pager [phone]  If 7PM-7AM, please contact night-coverage [email or web address] Password [email or web address] 12/24/2015, 6:09 PM

## 2015-12-24 NOTE — Consult Note (Signed)
CARDIOLOGY CONSULT NOTE  Patient ID: Kenneth Onorato., MRN: [id number], DOB/AGE: 1934-02-26 81 y.o. Admit date: 12/23/2015 Date of Consult: 12/24/2015  Primary Physician: [name], MD Primary Cardiologist: new Consulting Physician [name]  Chief Complaint: AAA preop evaluation    HPI Kenneth CLOSSON Sr. is a 81 y.o. male  With a large incidentally identified AAA referred from [place] because of concerns about " contained rupture" which was not felt to be true following CT evaluation. The aneurysm is large at 8.1 cm in this regard it is notable that his father also had a large aneurysm..   Echocardiogram has been ordered but is not yet done \ He has no known cardiac history. However, over the last 3 months or so he has become profoundly short of breath and dyspneic at less than 10 feet. He does not have significant orthopnea or peripheral edema. He denies chest discomfort.  Laboratories are notable for moderate renal insufficiency with a GFR 31 somewhat improved from the day before       Past Medical History:  Diagnosis Date  . Arteriosclerotic cardiovascular disease   . Chronic kidney disease   . Chronic kidney disease, stage IV (severe) (HCC)   . DJD (degenerative joint disease)   . Hypertension   . Hypertensive renal disease   . Renal tubular acidosis   . Secondary hyperparathyroidism [facility])       Surgical History:  Past Surgical History:  Procedure Laterality Date  . APPENDECTOMY    . AV FISTULA PLACEMENT Right 06/28/2015   Procedure: RIGHT BRACHIOCEPHALIC ARTERIOVENOUS (AV) FISTULA CREATION;  Surgeon: Fransisco Hertz, MD;  Location: [facility] OR;  Service: Vascular;  Laterality: Right;  . HIP FRACTURE SURGERY Left   . MULTIPLE TOOTH EXTRACTIONS    . TONSILLECTOMY       Home Meds: Prior to Admission medications   Medication Sig Start Date End Date Taking? Authorizing Provider  allopurinol (ZYLOPRIM) 300 MG tablet Take 300 mg by mouth daily.   Yes Historical  Provider, MD  aspirin EC 81 MG tablet Take 81 mg by mouth daily.   Yes Historical Provider, MD  B Complex-C-Folic Acid (RENA-VITE RX) 1 MG TABS Take 1 mg by mouth daily.   Yes Historical Provider, MD  calcium acetate (PHOSLO) 667 MG capsule Take 667 mg by mouth 2 (two) times daily with a meal.   Yes Historical Provider, MD  fluticasone (FLONASE) 50 MCG/ACT nasal spray Place 1 spray into both nostrils daily as needed. 06/15/15  Yes Historical Provider, MD  loratadine (CLARITIN) 10 MG tablet Take 10 mg by mouth at bedtime.   Yes Historical Provider, MD  metoprolol succinate (TOPROL-XL) 50 MG 24 hr tablet Take 50 mg by mouth at bedtime. Take with or immediately following a meal.   Yes Historical Provider, MD  rosuvastatin (CRESTOR) 20 MG tablet Take 20 mg by mouth daily. 12/20/15  Yes Historical Provider, MD  sodium bicarbonate 650 MG tablet Take 1,300 mg by mouth 2 (two) times daily.   Yes Historical Provider, MD  VENTOLIN HFA 108 (90 Base) MCG/ACT inhaler Inhale 1 puff into the lungs every 4 (four) hours as needed for wheezing. 12/20/15  Yes Historical Provider, MD  oxyCODONE-acetaminophen (ROXICET) 5-325 MG tablet Take 1-2 tablets by mouth every 6 (six) hours as needed for moderate pain. Patient not taking: Reported on 12/23/2015 06/28/15   Fransisco Hertz, MD    Inpatient Medications:  . allopurinol  300 mg Oral Daily  .  aspirin EC  81 mg Oral Daily  . calcium acetate  667 mg Oral BID WC  . loratadine  10 mg Oral QHS  . metoprolol succinate  50 mg Oral QHS  . multivitamin  1 tablet Oral QHS  . rosuvastatin  20 mg Oral Daily  . sodium bicarbonate  1,300 mg Oral BID    Allergies:  Allergies  Allergen Reactions  . Phenobarbital Hives and Itching  . Prednisone Rash    Social History   Social History  . Marital status: Married    Spouse name: N/A  . Number of children: N/A  . Years of education: N/A   Occupational History  . Not on file.   Social History Main Topics  . Smoking  status: Former Smoker    Types: Cigarettes  . Smokeless tobacco: Current User    Types: Chew     Comment: " years ago i quit smoking cigarettes"  . Alcohol use No  . Drug use: No  . Sexual activity: Not on file   Other Topics Concern  . Not on file   Social History Narrative  . No narrative on file     Family History  Problem Relation Age of Onset  . Diabetes Mother   . Stroke Father   . AAA (abdominal aortic aneurysm) Father      ROS:  Please see the history of present illness.     All other systems reviewed and negative.    Physical Exam:  Blood pressure 133/72, pulse 65, temperature 98 F (36.7 C), temperature source Oral, resp. rate 16, height 5\' 6"  (1.676 m), weight 163 lb 9.6 oz (74.2 kg), SpO2 92 %. General: Well developed, well nourished male in no acute distress. Head: Normocephalic, atraumatic, sclera non-icteric, no xanthomas, nares are without discharge. EENT: normal  Lymph Nodes:  none Neck: Negative for carotid bruits. JVD >10 Back:without scoliosis kyphosis  Lungs: Clear bilaterally to auscultation without wheezes, rales, or rhonchi. Breathing is unlabored. Heart: Rapid RR with S1 S2.  2/6 systolic murmur . No rubs, or gallops appreciated. Abdomen: Soft, non-tender, non-distended with normoactive bowel sounds. No hepatomegaly. No rebound/guarding. No obvious abdominal masses. Msk:  Strength and tone appear normal for age. Extremities: No clubbing or cyanosis. Tr edema.  Distal pedal pulses are 2+ and equal bilaterally. Skin: Warm and Dry Neuro: Alert and oriented X 3. CN III-XII intact Grossly normal sensory and motor function . Psych:  Responds to questions appropriately with a normal affect.      Labs: Cardiac Enzymes No results for input(s): CKTOTAL, CKMB, TROPONINI in the last 72 hours. CBC Lab Results  Component Value Date   WBC 7.1 12/24/2015   HGB 15.3 12/24/2015   HCT 45.6 12/24/2015   MCV 98.5 12/24/2015   PLT 125 (L) 12/24/2015    PROTIME:  Recent Labs  12/23/15 1445  LABPROT 13.6  INR 1.03   Chemistry  Recent Labs Lab 12/23/15 1645 12/24/15 0311  NA 138 140  K 4.2 4.5  CL 112* 114*  CO2 20* 19*  BUN 25* 23*  CREATININE 2.09* 1.97*  CALCIUM 9.6 8.8*  PROT 6.2*  --   BILITOT 0.8  --   ALKPHOS 59  --   ALT 9*  --   AST 22  --   GLUCOSE 92 91   Lipids No results found for: CHOL, HDL, LDLCALC, TRIG BNP No results found for: PROBNP Thyroid Function Tests: No results for input(s): TSH, T4TOTAL, T3FREE, THYROIDAB in the last  72 hours.  Invalid input(s): FREET3 Miscellaneous No results found for: DDIMER  Radiology/Studies:  Ct Angio Chest/abd/pel For Dissection W And/or W/wo  Result Date: 12/23/2015 CLINICAL DATA:  Abdominal aortic aneurysm with worsening back pain. EXAM: CT ANGIOGRAPHY CHEST, ABDOMEN AND PELVIS TECHNIQUE: Multidetector CT imaging through the chest, abdomen and pelvis was performed using the standard protocol during bolus administration of intravenous contrast. Multiplanar reconstructed images and MIPs were obtained and reviewed to evaluate the vascular anatomy. CONTRAST:  100 cc of Isovue 370 IV COMPARISON:  MRI from 12/23/2015, 08/29/2007 ultrasound in FINDINGS: CTA CHEST FINDINGS Cardiovascular: Satisfactory opacification of the pulmonary arteries to the segmental level. No evidence of pulmonary embolism. The heart is enlarged. No pericardial effusion. There is three-vessel coronary arteriosclerosis. No thoracic aortic aneurysm nor dissection. Mediastinum/Nodes: No enlarged mediastinal, hilar, or axillary lymph nodes. Thyroid gland, trachea, and esophagus demonstrate no significant findings. Lungs/Pleura: Bilateral paraseptal and centrilobular emphysema with superimposed ground-glass opacities which may represent sequela mild pulmonary edema. Musculoskeletal: Anterior compression of T7 which appears chronic without evidence of paraspinal hematoma or retropulsion. Degenerative disc  disease from T1 through T5 and T7 through T11. Review of the MIP images confirms the above findings. CTA ABDOMEN AND PELVIS FINDINGS VASCULAR Aorta: There is a fusiform infrarenal abdominal aortic aneurysm measuring up to 8.1 cm transverse. The proximal neck measures 2.2 cm in caliber and is approximately 3.5 cm from the left renal artery origin. The distal aspect of the fusiform aortic aneurysm extends to the aortic bifurcation were there is an ectatic appearance of both common iliac arteries, on the right measuring up to 1.9 cm and on the left, appearing more aneurysmal measuring 2.9 cm with mild circumferential soft plaque. The external iliac artery on the right measures 0.6 cm and on the left 0.8 cm and are patent. No evidence of abdominal aortic leak. No retroperitoneal fluid is seen. Along the left lateral soft plaque are developing calcifications which account for the hyperdensity seen. No communication with intraluminal contrast to suggested an ulcerated plaque. There is a small thrombosed 1.5 cm aneurysm of what appears to be a lumbar artery branch, series 501 image 170. Celiac: Patent without evidence of aneurysm, dissection, vasculitis or significant stenosis. Atherosclerosis is noted at the origin. SMA: Atherosclerosis at the origin of the SMA. No dissection or aneurysm. Renals: Single renal arteries to both kidneys, attenuated on the right measuring 4 mm in caliber and 9 mm on the left. Small webs are noted in the proximal renal arteries. IMA: Patent without evidence of aneurysm, dissection, vasculitis or significant stenosis. Inflow: Patent without evidence of aneurysm, dissection, vasculitis or significant stenosis.Atherosclerosis of the descending thoracic aorta without aneurysm. Veins: No obvious venous abnormality within the limitations of this arterial phase study. Review of the MIP images confirms the above findings. NON-VASCULAR Hepatobiliary: There are too numerous to count hypodensities within  the liver statistically consistent with cysts and/or hemangiomata. There is a granuloma in the right hepatic lobe. The gallbladder is not distended but contains several calculi. No wall thickening is noted. Pancreas: Fatty atrophy of the pancreas without ductal dilatation or focal mass. Spleen: The spleen is not enlarged. Adrenals/Urinary Tract: Normal bilateral adrenal glands. Right worse than left renal cortical atrophy with bilateral simple and complex renal cysts. Extrarenal pelves are seen bilaterally. Urinary bladder is distended without acute abnormalities. Stomach/Bowel: Normal bowel rotation. No acute inflammation. There is colonic diverticulosis without acute diverticulitis. Lymphatic: No lymphadenopathy. Reproductive: Prostate appears unremarkable. Other: No ascites or pneumoperitoneum. Musculoskeletal: No acute  osseous abnormality.  Lumbar spondylosis. Review of the MIP images confirms the above findings. IMPRESSION: 1. Infrarenal fusiform abdominal aortic aneurysm measuring 8 cm transverse without evidence of acute intramural hematoma or aortic leak. Developing calcifications seen within the soft plaque along the left lateral aspect of this aneurysm. The aneurysm is 3.5 cm from the renal arteries it measures approximately 2.2 cm in caliber. The aneurysm terminates at the aortic bifurcation. There is ectasia of the right common iliac artery to 1.9 cm in and more aneurysmal dilatation of the left common iliac artery to 2.9 cm with plaque noted. The external iliac arteries are patent measuring 0.6 cm on the right and 0.8 cm on the left. 2. Numerous hepatic and bilateral renal hypodensities consistent with cysts. Atrophic appearing kidneys bilaterally. 3. No pulmonary embolus or thoracic aortic aneurysm. No aortic dissection. All 4.  Pulmonary emphysema. 5. No acute thoracic abnormality. Chronic appearing T7 compression. Critical Value/emergent results were called by telephone at the time of interpretation  on 12/23/2015 at 5:13 pm to Dr. Alvira Monday , who verbally acknowledged these results and earlier to Dr. [name] of Vascular Surgery. Electronically Signed   By: Tollie Eth M.D.   On: 12/23/2015 17:14    EKG:  Personally reviewed sinus rhythm. There is left axis deviation and poor R-wave progression raising concerns for possible anterior wall MI  Assessment and Plan:  Very large AAA  Dyspnea on exertion-class IV  Family history of aneurysmal disease  Evidence of right-sided heart failure   The patient has severe dyspnea. The subacute onset over recent months suggests that there is a cardiac component to it as opposed to just his chronic emphysematous disease. His right heart failure as manifested by elevated JVP may be related to pulmonary hypertension from his pulmonary disease or secondary to left sided disease as well.  A brief review of up-to-date suggests that severe comorbidities strongly mitigates benefit from repair either surgical or endovascular. Obviously this is way beyond my knowledge base. However, his dyspnea clearly was admitted with severe comorbidity group and trying to understand this will help inform surgical decision-making.  Hence, I agree with the echo which is been ordered. I also wonder all my colleagues to weigh in on this as to whether Myoview scanning further informs risk assessment in this cohort.  I discussed this with Dr. Madaline Savage

## 2015-12-25 ENCOUNTER — Observation Stay (HOSPITAL_BASED_OUTPATIENT_CLINIC_OR_DEPARTMENT_OTHER): Payer: Medicare Other

## 2015-12-25 DIAGNOSIS — M4854XA Collapsed vertebra, not elsewhere classified, thoracic region, initial encounter for fracture: Secondary | ICD-10-CM | POA: Diagnosis present

## 2015-12-25 DIAGNOSIS — I50810 Right heart failure, unspecified: Secondary | ICD-10-CM | POA: Diagnosis present

## 2015-12-25 DIAGNOSIS — R9439 Abnormal result of other cardiovascular function study: Secondary | ICD-10-CM | POA: Diagnosis not present

## 2015-12-25 DIAGNOSIS — I2729 Other secondary pulmonary hypertension: Secondary | ICD-10-CM | POA: Diagnosis present

## 2015-12-25 DIAGNOSIS — N184 Chronic kidney disease, stage 4 (severe): Secondary | ICD-10-CM | POA: Diagnosis not present

## 2015-12-25 DIAGNOSIS — I713 Abdominal aortic aneurysm, ruptured: Secondary | ICD-10-CM | POA: Diagnosis not present

## 2015-12-25 DIAGNOSIS — M5440 Lumbago with sciatica, unspecified side: Secondary | ICD-10-CM | POA: Diagnosis not present

## 2015-12-25 DIAGNOSIS — M6281 Muscle weakness (generalized): Secondary | ICD-10-CM | POA: Diagnosis not present

## 2015-12-25 DIAGNOSIS — J84112 Idiopathic pulmonary fibrosis: Secondary | ICD-10-CM | POA: Diagnosis present

## 2015-12-25 DIAGNOSIS — J9601 Acute respiratory failure with hypoxia: Secondary | ICD-10-CM

## 2015-12-25 DIAGNOSIS — I272 Pulmonary hypertension, unspecified: Secondary | ICD-10-CM | POA: Diagnosis not present

## 2015-12-25 DIAGNOSIS — I714 Abdominal aortic aneurysm, without rupture: Secondary | ICD-10-CM

## 2015-12-25 DIAGNOSIS — I712 Thoracic aortic aneurysm, without rupture: Secondary | ICD-10-CM | POA: Diagnosis present

## 2015-12-25 DIAGNOSIS — R531 Weakness: Secondary | ICD-10-CM | POA: Diagnosis not present

## 2015-12-25 DIAGNOSIS — X58XXXA Exposure to other specified factors, initial encounter: Secondary | ICD-10-CM | POA: Diagnosis present

## 2015-12-25 DIAGNOSIS — I25110 Atherosclerotic heart disease of native coronary artery with unstable angina pectoris: Secondary | ICD-10-CM | POA: Diagnosis not present

## 2015-12-25 DIAGNOSIS — M1990 Unspecified osteoarthritis, unspecified site: Secondary | ICD-10-CM | POA: Diagnosis present

## 2015-12-25 DIAGNOSIS — Z7982 Long term (current) use of aspirin: Secondary | ICD-10-CM | POA: Diagnosis not present

## 2015-12-25 DIAGNOSIS — M109 Gout, unspecified: Secondary | ICD-10-CM | POA: Diagnosis present

## 2015-12-25 DIAGNOSIS — E785 Hyperlipidemia, unspecified: Secondary | ICD-10-CM | POA: Diagnosis present

## 2015-12-25 DIAGNOSIS — Z87891 Personal history of nicotine dependence: Secondary | ICD-10-CM | POA: Diagnosis not present

## 2015-12-25 DIAGNOSIS — Z79899 Other long term (current) drug therapy: Secondary | ICD-10-CM | POA: Diagnosis not present

## 2015-12-25 DIAGNOSIS — K551 Chronic vascular disorders of intestine: Secondary | ICD-10-CM | POA: Diagnosis present

## 2015-12-25 DIAGNOSIS — I130 Hypertensive heart and chronic kidney disease with heart failure and stage 1 through stage 4 chronic kidney disease, or unspecified chronic kidney disease: Secondary | ICD-10-CM | POA: Diagnosis present

## 2015-12-25 DIAGNOSIS — M5441 Lumbago with sciatica, right side: Secondary | ICD-10-CM | POA: Diagnosis present

## 2015-12-25 DIAGNOSIS — R5381 Other malaise: Secondary | ICD-10-CM

## 2015-12-25 DIAGNOSIS — N2581 Secondary hyperparathyroidism of renal origin: Secondary | ICD-10-CM | POA: Diagnosis present

## 2015-12-25 DIAGNOSIS — I2721 Secondary pulmonary arterial hypertension: Secondary | ICD-10-CM | POA: Diagnosis not present

## 2015-12-25 DIAGNOSIS — R0609 Other forms of dyspnea: Secondary | ICD-10-CM | POA: Diagnosis not present

## 2015-12-25 DIAGNOSIS — Z79891 Long term (current) use of opiate analgesic: Secondary | ICD-10-CM | POA: Diagnosis not present

## 2015-12-25 DIAGNOSIS — I2510 Atherosclerotic heart disease of native coronary artery without angina pectoris: Secondary | ICD-10-CM | POA: Diagnosis not present

## 2015-12-25 DIAGNOSIS — R262 Difficulty in walking, not elsewhere classified: Secondary | ICD-10-CM | POA: Diagnosis present

## 2015-12-25 DIAGNOSIS — Z888 Allergy status to other drugs, medicaments and biological substances status: Secondary | ICD-10-CM | POA: Diagnosis not present

## 2015-12-25 LAB — CBC WITH DIFFERENTIAL/PLATELET
Basophils Absolute: 0.1 10*3/uL (ref 0.0–0.1)
Basophils Relative: 1 %
EOS ABS: 0.5 10*3/uL (ref 0.0–0.7)
EOS PCT: 6 %
HCT: 48.4 % (ref 39.0–52.0)
Hemoglobin: 16.3 g/dL (ref 13.0–17.0)
LYMPHS ABS: 1.3 10*3/uL (ref 0.7–4.0)
LYMPHS PCT: 17 %
MCH: 33.1 pg (ref 26.0–34.0)
MCHC: 33.7 g/dL (ref 30.0–36.0)
MCV: 98.4 fL (ref 78.0–100.0)
MONO ABS: 0.6 10*3/uL (ref 0.1–1.0)
MONOS PCT: 8 %
Neutro Abs: 5.5 10*3/uL (ref 1.7–7.7)
Neutrophils Relative %: 69 %
PLATELETS: 118 10*3/uL — AB (ref 150–400)
RBC: 4.92 MIL/uL (ref 4.22–5.81)
RDW: 15.4 % (ref 11.5–15.5)
WBC: 8 10*3/uL (ref 4.0–10.5)

## 2015-12-25 LAB — COMPREHENSIVE METABOLIC PANEL
ALBUMIN: 3.3 g/dL — AB (ref 3.5–5.0)
ALK PHOS: 53 U/L (ref 38–126)
ALT: 7 U/L — ABNORMAL LOW (ref 17–63)
ANION GAP: 7 (ref 5–15)
AST: 21 U/L (ref 15–41)
BILIRUBIN TOTAL: 0.8 mg/dL (ref 0.3–1.2)
BUN: 21 mg/dL — AB (ref 6–20)
CALCIUM: 9.1 mg/dL (ref 8.9–10.3)
CO2: 18 mmol/L — ABNORMAL LOW (ref 22–32)
Chloride: 115 mmol/L — ABNORMAL HIGH (ref 101–111)
Creatinine, Ser: 2.04 mg/dL — ABNORMAL HIGH (ref 0.61–1.24)
GFR calc Af Amer: 34 mL/min — ABNORMAL LOW (ref >60)
GFR, EST NON AFRICAN AMERICAN: 29 mL/min — AB (ref >60)
GLUCOSE: 77 mg/dL (ref 65–99)
POTASSIUM: 4.6 mmol/L (ref 3.5–5.1)
Sodium: 140 mmol/L (ref 135–145)
TOTAL PROTEIN: 5.3 g/dL — AB (ref 6.5–8.1)

## 2015-12-25 LAB — ECHOCARDIOGRAM COMPLETE
Height: 66 in
WEIGHTICAEL: 2617.6 [oz_av]

## 2015-12-25 LAB — PHOSPHORUS: Phosphorus: 2.3 mg/dL — ABNORMAL LOW (ref 2.5–4.6)

## 2015-12-25 LAB — MAGNESIUM: MAGNESIUM: 2 mg/dL (ref 1.7–2.4)

## 2015-12-25 MED ORDER — ALBUTEROL SULFATE (2.5 MG/3ML) 0.083% IN NEBU
2.5000 mg | INHALATION_SOLUTION | Freq: Four times a day (QID) | RESPIRATORY_TRACT | Status: DC
  Filled 2015-12-25: qty 3

## 2015-12-25 MED ORDER — ALBUTEROL SULFATE (2.5 MG/3ML) 0.083% IN NEBU
2.5000 mg | INHALATION_SOLUTION | Freq: Three times a day (TID) | RESPIRATORY_TRACT | Status: DC
  Administered 2015-12-25 – 2015-12-26 (×3): 2.5 mg via RESPIRATORY_TRACT
  Filled 2015-12-25 (×2): qty 3

## 2015-12-25 NOTE — Progress Notes (Addendum)
   Daily Progress Note   Pt was getting echo in room.  I discussed briefly with the patient that he likely would be high risk for an open aortic repair.  Will see if he is a EVAR candidate after reviewing study with [employer] rep tomorrow.    - At this point, pt is not considering expectant management for a ruptured AAA so will need to consider his repair options at this time - Awaiting completion of cardiac work-up   Leonides SakeBrian Suzanne Garbers, MD, FACS [facility] Office: [phone] Pager: [phone]  12/25/2015, 8:18 AM

## 2015-12-25 NOTE — Progress Notes (Signed)
SATURATION QUALIFICATIONS: (This note is used to comply with regulatory documentation for home oxygen)  Patient Saturations on Room Air at Rest = 88%  Patient Saturations on 3L while Ambulating = 85%  Patient Saturations on 5 Liters of oxygen while Ambulating = 91%  Please briefly explain why patient needs home oxygen: Pt unable to maintain oxygen saturations on RA at rest and required 5L with gait to maintain >90% Delaney MeigsMaija Tabor Cuthbert Turton, PT [phone]

## 2015-12-25 NOTE — Progress Notes (Signed)
Further risk assessment pending echo results.

## 2015-12-25 NOTE — Progress Notes (Signed)
Echocardiogram 2D Echocardiogram has been performed.  [name], [name] M 12/25/2015, 8:18 AM

## 2015-12-25 NOTE — Evaluation (Addendum)
Physical Therapy Evaluation Patient Details Name: Kenneth PyoDavid G Chittick Sr. MRN: [id number] DOB: 07/01/1934 Today's Date: 12/25/2015   History of Present Illness  81 yo admitted with AAA after incidental finding on spinal MRI. PMhx: CKD, HTN, HLD, gout, back pain, left hip fx  Clinical Impression  Pt pleasant but initially hesitant to perform any mobility stating he gets very SOB quickly. Pt O2 sats on RA in bed 88% with 2L rose to 92%, On 3L with gait dropped to 85% and on 5L with gait maintained 91%. Pt reports he has been becoming increasingly weak and fatigued over the last month and after application of 5L pt walked further today than he has in a month. Pt with impaired balance, decreased strength, gait and function who will benefit from acute therapy to maximize mobility, independence and safety to decrease burden of care. Pt very agreeable and appreciative of session. Pt educated for transfers, energy conservation, recommendation for pulse ox for home and progression.   HR 74-84    Follow Up Recommendations Home health PT    Equipment Recommendations  Other (comment) (tub bench)    Recommendations for Other Services OT consult     Precautions / Restrictions Precautions Precautions: Fall Precaution Comments: watch sats      Mobility  Bed Mobility Overal bed mobility: Needs Assistance Bed Mobility: Supine to Sit     Supine to sit: Min assist     General bed mobility comments: cues for ease of sequence to roll and rise from surface with assist to rotate and elevate trunk   Transfers Overall transfer level: Needs assistance   Transfers: Sit to/from Stand Sit to Stand: Min guard         General transfer comment: cues for hand placement and sequence  Ambulation/Gait Ambulation/Gait assistance: Min guard Ambulation Distance (Feet): 80 Feet Assistive device: Rolling walker (2 wheeled) Gait Pattern/deviations: Step-through pattern;Decreased stride length;Trunk flexed    Gait velocity interpretation: Below normal speed for age/gender General Gait Details: mod cues for posture and position in RW as well as breathing technique.   Stairs            Wheelchair Mobility    Modified Rankin (Stroke Patients Only)       Balance Overall balance assessment: Needs assistance   Sitting balance-Leahy Scale: Fair       Standing balance-Leahy Scale: Poor                               Pertinent Vitals/Pain Pain Assessment: 0-10 Pain Score: 4  Pain Location: back Pain Descriptors / Indicators: Aching Pain Intervention(s): Limited activity within patient's tolerance;Monitored during session;Repositioned    Home Living Family/patient expects to be discharged to:: Private residence Living Arrangements: Spouse/significant other Available Help at Discharge: Family;Available PRN/intermittently Type of Home: House Home Access: Stairs to enter Entrance Stairs-Rails: Right Entrance Stairs-Number of Steps: 3 Home Layout: One level Home Equipment: Walker - 2 wheels;Bedside commode      Prior Function Level of Independence: Needs assistance   Gait / Transfers Assistance Needed: pt reports he was using a walking stick, limited to home mobility, difficulty rising from surfaces, increased time to achieve transfers  ADL's / Homemaking Assistance Needed: sponge bathing due to SOB and fatigue, assist at times from wife for dressing, does not drive        Hand Dominance        Extremity/Trunk Assessment   Upper Extremity Assessment: Generalized  weakness           Lower Extremity Assessment: Generalized weakness      Cervical / Trunk Assessment: Kyphotic  Communication   Communication: No difficulties  Cognition Arousal/Alertness: Awake/alert Behavior During Therapy: WFL for tasks assessed/performed Overall Cognitive Status: Within Functional Limits for tasks assessed                      General Comments       Exercises     Assessment/Plan    PT Assessment Patient needs continued PT services  PT Problem List Decreased strength;Decreased mobility;Decreased activity tolerance;Decreased balance;Cardiopulmonary status limiting activity;Decreased knowledge of use of DME          PT Treatment Interventions Gait training;Stair training;Functional mobility training;Balance training;Patient/family education;Therapeutic exercise;Therapeutic activities;DME instruction    PT Goals (Current goals can be found in the Care Plan section)  Acute Rehab PT Goals Patient Stated Goal: be able to walk without giving out PT Goal Formulation: With patient/family Time For Goal Achievement: 01/08/16 Potential to Achieve Goals: Good    Frequency Min 3X/week   Barriers to discharge Decreased caregiver support      Co-evaluation               End of Session Equipment Utilized During Treatment: Gait belt;Oxygen Activity Tolerance: Patient tolerated treatment well Patient left: in chair;with call bell/phone within reach;with family/visitor present Nurse Communication: Mobility status    Functional Assessment Tool Used: clinical judgement Functional Limitation: Mobility: Walking and moving around Mobility: Walking and Moving Around Current Status ([id number]): At least 20 percent but less than 40 percent impaired, limited or restricted Mobility: Walking and Moving Around Goal Status [id number](G8979): At least 1 percent but less than 20 percent impaired, limited or restricted    Time: 1343-1430 PT Time Calculation (min) (ACUTE ONLY): 47 min   Charges:   PT Evaluation $PT Eval Moderate Complexity: 1 Procedure PT Treatments $Gait Training: 8-22 mins $Therapeutic Activity: 8-22 mins   PT G Codes:   PT G-Codes **NOT FOR INPATIENT CLASS** Functional Assessment Tool Used: clinical judgement Functional Limitation: Mobility: Walking and moving around Mobility: Walking and Moving Around Current Status ([id number]): At  least 20 percent but less than 40 percent impaired, limited or restricted Mobility: Walking and Moving Around Goal Status [id number](G8979): At least 1 percent but less than 20 percent impaired, limited or restricted    [name] 12/25/2015, 2:50 PM  Delaney MeigsMaija Tabor Sharlette Jansma, PT [phone]

## 2015-12-25 NOTE — Progress Notes (Addendum)
PROGRESS NOTE    [name]  RUE:[id number] DOB: 14-Jun-1934 DOA: 12/23/2015 PCP: [name], MD  Brief Narrative: Kenneth Pyo Sr. is a 81 y.o. male with medical history significant of chronic kidney disease stage IV, hypertension, hyperlipidemia. Patient was having back pain and his PCP sent him for an MRI of his back. Patient had returned home after the MRI when he was called by radiology and told to come to the ER for an incidental finding of a thoracic aortic aneurysm. Patient's back pain previously had been associated with degenerative disc disease with radicular symptoms in his legs. Patient had a CTA in the ER of the chest abdomen and pelvis. He was found to have a large infrarenal AAA that may be compatible for EVAR repair. He was seen by Dr. [name] in the ER suggested that patient be watched in the hospital after receiving IV contrast with chronic kidney disease stage IV. Per patient his normal GFR ranges between 15-20. He follows with Dr. [name] on a regular basis.  Per vascular they would like a Cardiology consult to get a preop evaluation. Tentatively surgery is planned for December 4. Cardiology came to evaluate the patient and are concerned that his dyspnea may have a cardiac component as opposed to just his chronic Emphysemetatous Disease and Right Heart Failure may be may be related to Pulmonary HTN from his pulmonary Disease or 2/2 to Left-Sided Disease so is undergoing an Echocardiogram and possible Myoview Stress Test. ECHO was done today and PT evaluated the patient and patient required O2 via [place] for Ambulation. Awaiting Cardiology recommendations for further Risk Assessment.   Assessment & Plan:   Active Problems:   Chronic kidney disease (CKD), stage IV (severe) (HCC)   AAA (abdominal aortic aneurysm) (HCC)  AAA -Seen by Vascular- Dr. [name] -Per Dr. Nicky Pugh note There is no evidence of rupture on the CTA despite the MRI findings.  +There is NO indicate for emergent  repair of this large AAA.  +Due to his CKD IV and contrast load needed for the CTA, I recommend admission to Hospitalist service for IV hydration and observation for possible contrast induced nephropathy leading to ARF.  +While admitted, would get Cardiac consultation to expedite preop evaluation.  +Will get the CTA reviewed with my [employer] rep on Monday as I suspect his anatomy is outside of the IFU for the C3 device. I still suspect it can be repaired with such.  +Would subsequently plan on proceeding with EVAR the week of 4th of Dec.  -Cardiology Evaluated and concerned about patient's dyspnea and may have a cardiac component as opposed to just chronic Emphysemetous disease and Right Heart Failure (evidenced by JVP on Cardiology Exam) may be related to Pulmonary HTN or Left Sided Heart Disease -ECHOcardiogram done today and showed Normal LV systolic function; grade 1 diastolic dysfunction; mildly dilated aortic root; trace MR; mild TR with severely  elevated pulmonary pressure. -Further Risk Assessment per Cardiology. ?Possible Myoview Stress Test; Will defer to their recommendations   CKD-stage IV -follows with Dr. [name] -Has fistula that is ready for possible dialysis -IVF as patient got a contrast load with CTA; D/C'd IVF now -Patients BUN/Cr went from 25/2.09 -> 23/1.97 -> 21/2.04  -C/w Sodium Bicarbonate 1300 mg po BID, Calcium Acetate po 667, and with Rena-Vit 1 tab po qHS -Repeat BMP in AM  HLD -C/w Rosovastatin 20 mg po Daily   GOUT -C/w Allopurinol 300 mg po Daily  Generalized Weakness and Deconditioning with Increased  Back Pain causing difficulty in walking -Discussed with Neurosurgery Dr. Bevely Palmeritty and states no indication for inpatient evaluation and to follow up as an outpatient -Hx of Herniated Disks -PT to Evaluate and Treat -> Recommended Home Health PT  Acute Hypoxic Respiratory Failure -Room Air Saturations were 88% -Scheduled Albuterol Nebs q6h instead of  prn -CXR in AM -Patient improved with O2 via [place] -Walk Screen qualifies patient for Home O2 -Likely from Pulmonary Hypertension as PA Pressure was 70. -May need Pulmonary Evaluation and possible Right Heart Cath -D/C'd IVF. Possible Diuresis? Will Defer to Cardiology  T7 Compression Fracture -Continue with Physical Therapy -Pain Control with Acetaminophen  DVT prophylaxis: SCD's Code Status: FULL Family Communication: Discussed Plan of Care with Wife at bedside Disposition Plan: Pending Cardiology Recc's  Consultants:   Vascular Surgery  Cardiology  Procedures: ECHOcardiogram  Antimicrobials: None  Subjective: Seen and examined at bedside and states back was hurting again causing pain and numbness and tingling in his legs. No N/V/Abdominal Pain. States he cannot walk very far anymore and probably could not walk to the door. No other concerns or complaints.   Objective: Vitals:   12/24/15 2240 12/25/15 0630 12/25/15 1444 12/25/15 1450  BP: (!) 144/89 128/81  (!) 141/78  Pulse: 71 63 84 72  Resp:  18  18  Temp:  97.6 F (36.4 C)  97.6 F (36.4 C)  TempSrc:  Oral  Oral  SpO2:  91% 93% 97%  Weight:      Height:        Intake/Output Summary (Last 24 hours) at 12/25/15 1705 Last data filed at 12/24/15 2208  Gross per 24 hour  Intake                0 ml  Output              200 ml  Net             -200 ml   Filed Weights   12/23/15 1448 12/23/15 2029 12/24/15 0600  Weight: 73.5 kg (162 lb) 72.5 kg (159 lb 14.4 oz) 74.2 kg (163 lb 9.6 oz)    Examination: Physical Exam:  Constitutional: WN/WD, NAD and appears calm  Eyes: Lids and conjunctivae normal, sclerae anicteric  ENMT: External Ears, Nose appear normal. Grossly normal hearing. Neck: Appears normal, supple, no cervical masses, normal ROM, no appreciable thyromegaly. Mild JVD Respiratory: Clear to auscultation bilaterally, no wheezing, rales, rhonchi or crackles. Normal respiratory effort and patient is not  tachypenic. No accessory muscle use.  Cardiovascular: RRR, Slight 2/6 Sysotlic Murmur. No rubs / gallops. S1 and S2 auscultated. No extremity edema. Abdomen: Soft, non-tender, non-distended. No masses palpated. No appreciable hepatosplenomegaly. Bowel sounds positive x4.  GU: Deferred. Musculoskeletal: No clubbing / cyanosis of digits/nails. No joint deformity upper and lower extremities. No contractures. Decreased Strength 4/5 in [place].  Skin: No rashes, lesions, ulcers. No induration; Warm and dry.  Neurologic: CN 2-12 grossly intact with no focal deficits. Sensation intact in all 4 Extremities. Romberg sign cerebellar reflexes not assessed. No saddle anesthesia.  Psychiatric: Normal judgment and insight. Alert and oriented x 3. Normal mood and appropriate affect.   Data Reviewed: I have personally reviewed following labs and imaging studies  CBC:  Recent Labs Lab 12/23/15 1445 12/24/15 0311 12/25/15 0423  WBC 10.6* 7.1 8.0  NEUTROABS 7.6  --  5.5  HGB 18.8* 15.3 16.3  HCT 54.8* 45.6 48.4  MCV 98.4 98.5 98.4  PLT 161 125* 118*  Basic Metabolic Panel:  Recent Labs Lab 12/23/15 1645 12/24/15 0311 12/25/15 0423  NA 138 140 140  K 4.2 4.5 4.6  CL 112* 114* 115*  CO2 20* 19* 18*  GLUCOSE 92 91 77  BUN 25* 23* 21*  CREATININE 2.09* 1.97* 2.04*  CALCIUM 9.6 8.8* 9.1  MG 2.2  --  2.0  PHOS  --   --  2.3*   GFR: Estimated Creatinine Clearance: 26.5 mL/min (by C-G formula based on SCr of 2.04 mg/dL (H)). Liver Function Tests:  Recent Labs Lab 12/23/15 1645 12/25/15 0423  AST 22 21  ALT 9* 7*  ALKPHOS 59 53  BILITOT 0.8 0.8  PROT 6.2* 5.3*  ALBUMIN 3.7 3.3*   No results for input(s): LIPASE, AMYLASE in the last 168 hours. No results for input(s): AMMONIA in the last 168 hours. Coagulation Profile:  Recent Labs Lab 12/23/15 1445  INR 1.03   Cardiac Enzymes: No results for input(s): CKTOTAL, CKMB, CKMBINDEX, TROPONINI in the last 168 hours. BNP (last 3  results) No results for input(s): PROBNP in the last 8760 hours. HbA1C: No results for input(s): HGBA1C in the last 72 hours. CBG: No results for input(s): GLUCAP in the last 168 hours. Lipid Profile: No results for input(s): CHOL, HDL, LDLCALC, TRIG, CHOLHDL, LDLDIRECT in the last 72 hours. Thyroid Function Tests: No results for input(s): TSH, T4TOTAL, FREET4, T3FREE, THYROIDAB in the last 72 hours. Anemia Panel: No results for input(s): VITAMINB12, FOLATE, FERRITIN, TIBC, IRON, RETICCTPCT in the last 72 hours. Sepsis Labs: No results for input(s): PROCALCITON, LATICACIDVEN in the last 168 hours.  No results found for this or any previous visit (from the past 240 hour(s)).   Radiology Studies: No results found. Scheduled Meds: . allopurinol  300 mg Oral Daily  . aspirin EC  81 mg Oral Daily  . calcium acetate  667 mg Oral BID WC  . loratadine  10 mg Oral QHS  . metoprolol succinate  50 mg Oral QHS  . multivitamin  1 tablet Oral QHS  . rosuvastatin  20 mg Oral Daily  . sodium bicarbonate  1,300 mg Oral BID   Continuous Infusions: . sodium chloride 30 mL/hr at 12/24/15 2248    LOS: 0 days   Merlene Laughtermair Latif Samary Shatz, DO [facility] Pager [phone]  If 7PM-7AM, please contact night-coverage [email or web address] Password [email or web address] 12/25/2015, 5:05 PM

## 2015-12-25 NOTE — Progress Notes (Signed)
Entered pt's room to give him Phoslo, he stated that he already took his from home along with other meds from home.  Wife stated she did not know he was not supposed to take his own meds and they would stop doing so.  Pt stated that he would continue to take his own meds as he was not receiving his meds when he was supposed to.  Will notify MD.

## 2015-12-26 DIAGNOSIS — I272 Pulmonary hypertension, unspecified: Secondary | ICD-10-CM

## 2015-12-26 DIAGNOSIS — M6281 Muscle weakness (generalized): Secondary | ICD-10-CM

## 2015-12-26 DIAGNOSIS — I2510 Atherosclerotic heart disease of native coronary artery without angina pectoris: Secondary | ICD-10-CM

## 2015-12-26 DIAGNOSIS — J8410 Pulmonary fibrosis, unspecified: Secondary | ICD-10-CM

## 2015-12-26 DIAGNOSIS — I714 Abdominal aortic aneurysm, without rupture: Principal | ICD-10-CM

## 2015-12-26 LAB — COMPREHENSIVE METABOLIC PANEL
ALK PHOS: 64 U/L (ref 38–126)
ALT: 9 U/L — AB (ref 17–63)
AST: 30 U/L (ref 15–41)
Albumin: 4.1 g/dL (ref 3.5–5.0)
Anion gap: 13 (ref 5–15)
BILIRUBIN TOTAL: 1 mg/dL (ref 0.3–1.2)
BUN: 27 mg/dL — AB (ref 6–20)
CALCIUM: 9.6 mg/dL (ref 8.9–10.3)
CHLORIDE: 109 mmol/L (ref 101–111)
CO2: 16 mmol/L — ABNORMAL LOW (ref 22–32)
CREATININE: 2.32 mg/dL — AB (ref 0.61–1.24)
GFR, EST AFRICAN AMERICAN: 29 mL/min — AB (ref >60)
GFR, EST NON AFRICAN AMERICAN: 25 mL/min — AB (ref >60)
Glucose, Bld: 89 mg/dL (ref 65–99)
Potassium: 4.7 mmol/L (ref 3.5–5.1)
Sodium: 138 mmol/L (ref 135–145)
TOTAL PROTEIN: 6.5 g/dL (ref 6.5–8.1)

## 2015-12-26 LAB — CBC WITH DIFFERENTIAL/PLATELET
BASOS ABS: 0 10*3/uL (ref 0.0–0.1)
Basophils Relative: 1 %
EOS PCT: 6 %
Eosinophils Absolute: 0.5 10*3/uL (ref 0.0–0.7)
HEMATOCRIT: 55.3 % — AB (ref 39.0–52.0)
Hemoglobin: 18.8 g/dL — ABNORMAL HIGH (ref 13.0–17.0)
LYMPHS ABS: 1.5 10*3/uL (ref 0.7–4.0)
LYMPHS PCT: 17 %
MCH: 33.3 pg (ref 26.0–34.0)
MCHC: 34 g/dL (ref 30.0–36.0)
MCV: 98 fL (ref 78.0–100.0)
Monocytes Absolute: 0.8 10*3/uL (ref 0.1–1.0)
Monocytes Relative: 9 %
NEUTROS ABS: 6.1 10*3/uL (ref 1.7–7.7)
Neutrophils Relative %: 68 %
Platelets: 107 10*3/uL — ABNORMAL LOW (ref 150–400)
RBC: 5.64 MIL/uL (ref 4.22–5.81)
RDW: 15.4 % (ref 11.5–15.5)
WBC: 8.9 10*3/uL (ref 4.0–10.5)

## 2015-12-26 LAB — PHOSPHORUS: PHOSPHORUS: 2.5 mg/dL (ref 2.5–4.6)

## 2015-12-26 LAB — MAGNESIUM: MAGNESIUM: 2.2 mg/dL (ref 1.7–2.4)

## 2015-12-26 LAB — SEDIMENTATION RATE: Sed Rate: 2 mm/hr (ref 0–16)

## 2015-12-26 MED ORDER — ALBUTEROL SULFATE (2.5 MG/3ML) 0.083% IN NEBU: 2.5000 mg | INHALATION_SOLUTION | RESPIRATORY_TRACT | Status: DC | PRN

## 2015-12-26 NOTE — Consult Note (Signed)
Name: Allene PyoDavid G Taubman Sr. MRN: [id number] DOB: 02/18/1934    ADMISSION DATE:  12/23/2015 CONSULTATION DATE:  12/26/15  REFERRING MD :  Dr. Marland McalpineSheikh   CHIEF COMPLAINT:  SOB    HISTORY OF PRESENT ILLNESS:  81 y/o M, former smoker (began age 81, quit age 81, 1+ ppd) with PMH of DJD, HTN, CAD, hypertensive renal disease / CKD 4 admitted 11/17 after incidental finding of thoracic aortic aneurysm.  The patient reports he had been having back pain and was sent for an MRI by his PCP. After he returned home, he was called by radiology and instructed to come to the ER for an incidental finding of a thoracic aortic aneurysm. Previously, the patient's back pain and been associated with his DJD and radicular symptoms in the legs. A CTA of the chest abdomen and pelvis was completed in the ER which showed a large infrarenal AAA.  The patient was evaluated and admitted by [facility] for further evaluation.  Dr. [name] of [facility] was consulted and felt AAA might be compatible with EVAR repair.  There also were concerns for a small B CIA aneurysm.  It was felt that there was no emergent repair needed at that time and the patient was scheduled for surgery on 12/4 to allow for further workup.  Cardiology was consulted for preoperative evaluation.  Echocardiogram was completed which revealed an LVEF of 55-60%, normal wall motion, grade 1 diastolic dysfunction, mildly dilated aortic root & PA peak pressure of 70 mmHg.    Given PA findings on echo, pulmonary medicine was consulted for pre-op evaluation.  Review of CTA images raises concern for emphysema.      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  Former smoker as above.  No recent travel.  He reports shortness of breath began approximately 2 months prior to admission with gradual onset. Prior to that, he denies any significant shortness of breath with exertion. He reports significant improvement with O2  application after admission.  Denies hx of sleep apnea.  No hx of autoimmune disorders.   PAST MEDICAL HISTORY :   has a past medical history of Arteriosclerotic cardiovascular disease; Chronic kidney disease; Chronic kidney disease, stage IV (severe) (HCC); DJD (degenerative joint disease); Hypertension; Hypertensive renal disease; Renal tubular acidosis; and Secondary hyperparathyroidism (HCC).   has a past surgical history that includes Appendectomy; Tonsillectomy; Hip fracture surgery (Left); Multiple tooth extractions; and AV fistula placement (Right, 06/28/2015).  Prior to Admission medications   Medication Sig Start Date End Date Taking? Authorizing Provider  allopurinol (ZYLOPRIM) 300 MG tablet Take 300 mg by mouth daily.   Yes Historical Provider, MD  aspirin EC 81 MG tablet Take 81 mg by mouth daily.   Yes Historical Provider, MD  B Complex-C-Folic Acid (RENA-VITE RX) 1 MG TABS Take 1 mg by mouth daily.   Yes Historical Provider, MD  calcium acetate (PHOSLO) 667 MG capsule Take 667 mg by mouth 2 (two) times daily with a meal.   Yes Historical Provider, MD  fluticasone (FLONASE) 50 MCG/ACT nasal spray Place 1 spray into both nostrils daily as needed. 06/15/15  Yes Historical Provider, MD  loratadine (CLARITIN) 10 MG tablet Take 10 mg by mouth at bedtime.   Yes Historical Provider, MD  metoprolol succinate (TOPROL-XL) 50 MG 24 hr tablet Take 50 mg by mouth at bedtime. Take with or immediately following a meal.   Yes Historical Provider, MD  rosuvastatin (CRESTOR) 20  MG tablet Take 20 mg by mouth daily. 12/20/15  Yes Historical Provider, MD  sodium bicarbonate 650 MG tablet Take 1,300 mg by mouth 2 (two) times daily.   Yes Historical Provider, MD  VENTOLIN HFA 108 (90 Base) MCG/ACT inhaler Inhale 1 puff into the lungs every 4 (four) hours as needed for wheezing. 12/20/15  Yes Historical Provider, MD  oxyCODONE-acetaminophen (ROXICET) 5-325 MG tablet Take 1-2 tablets by mouth every 6 (six)  hours as needed for moderate pain. Patient not taking: Reported on 12/23/2015 06/28/15   Fransisco HertzBrian L Chen, MD    Allergies  Allergen Reactions  . Phenobarbital Hives and Itching  . Prednisone Rash    FAMILY HISTORY:  family history includes AAA (abdominal aortic aneurysm) in his father; Diabetes in his mother; Stroke in his father.  SOCIAL HISTORY:  reports that he has quit smoking. His smoking use included Cigarettes. His smokeless tobacco use includes Chew. He reports that he does not drink alcohol or use drugs.  REVIEW OF SYSTEMS:  POSITIVES IN BOLD Constitutional: Negative for fever, chills, weight loss, malaise/fatigue and diaphoresis.  HENT: Negative for hearing loss, ear pain, nosebleeds, congestion, sore throat, neck pain, tinnitus and ear discharge.   Eyes: Negative for blurred vision, double vision, photophobia, pain, discharge and redness.  Respiratory: Negative for cough, hemoptysis, sputum production, shortness of breath, wheezing and stridor.   Cardiovascular: Negative for chest pain, palpitations, orthopnea, claudication, leg swelling and PND.  Gastrointestinal: Negative for heartburn, nausea, vomiting, abdominal pain, diarrhea, constipation, blood in stool and melena.  Genitourinary: Negative for dysuria, urgency, frequency, hematuria and flank pain.  Musculoskeletal: Negative for myalgias, back pain, joint pain and falls.  Skin: Negative for itching and rash.  Neurological: Negative for dizziness, tingling, tremors, sensory change, speech change, focal weakness, seizures, loss of consciousness, weakness and headaches.  Endo/Heme/Allergies: Negative for environmental allergies and polydipsia. Does not bruise/bleed easily.  SUBJECTIVE:   VITAL SIGNS: Temp:  [97.6 F (36.4 C)-98.1 F (36.7 C)] 97.7 F (36.5 C) (11/20 0555) Pulse Rate:  [62-84] 64 (11/20 0555) Resp:  [18] 18 (11/20 0555) BP: (109-141)/(58-78) 119/75 (11/20 0555) SpO2:  [93 %-98 %] 94 % (11/20  0749)  PHYSICAL EXAMINATION: General:  Thin elderly male in NAD  Neuro:  AAOx4, speech clear, MAE  HEENT:  MM pink/moist, jvd + Cardiovascular:  s1s2 rrr, no m/r/g Lungs:  Even/non-labored, lungs bilaterally with basilar inspiratory crackles R>L  Abdomen:  Soft, non-tender, bsx4 active  Musculoskeletal:  No acute deformities  Skin:  Warm/dry, no edema    Recent Labs Lab 12/24/15 0311 12/25/15 0423 12/26/15 0822  NA 140 140 138  K 4.5 4.6 4.7  CL 114* 115* 109  CO2 19* 18* 16*  BUN 23* 21* 27*  CREATININE 1.97* 2.04* 2.32*  GLUCOSE 91 77 89     Recent Labs Lab 12/24/15 0311 12/25/15 0423 12/26/15 0822  HGB 15.3 16.3 18.8*  HCT 45.6 48.4 55.3*  WBC 7.1 8.0 8.9  PLT 125* 118* 107*    No results found.    SIGNIFICANT EVENTS  11/17  Admit after incidental finding of thoracic aneurysm on MRI for back pain 11/20  PCCM consulted for evaluation of dyspnea  STUDIES:  11/17  CTA Chest/abd/pelvis >> infrarenal abdominal aortic aneurysm measuring 8 cm without evidence of leak, numerous hepatic and bilateral renal cysts, negative for PE, pulmonary emphysema 11/19  ECHO >> Normal LV systolic function; grade 1 diastolic dysfunction; mildly dilated aortic root; trace MR; mild TR with severely elevated  pulmonary pressure.   ASSESSMENT / PLAN:  Discussion:  81 y/o M admitted with incidental finding of a large AAA.  Planned for surgery 12/4 per Dr. [name].  Work up revealed severely elevated pulmonary pressures on ECHO.  Hx of smoking, upholstery work.  CT of the chest raises concern for possible ILD but they are expiratory cuts and motion noted.     Pulmonary Hypertension - suspect secondary to emphysema and occupational exposures. ? ILD on CT - consider HRCT of the chest to evaluate for pulmonary fibrosis given clinical exam and history - consider R heart cath to define [facility]  - assess for connective tissue disease for completeness given renal hx  Emphysema / COPD - not  defined, no hx of PFT's  - assess PFT's  - change nebs to q4 PRN to allow for assessment of bronchodilator response on PFT's  Acute Hypoxic Respiratory Failure - secondary to above, RA sats on admit 88% - O2 as needed to support sats 90-95% - will need ambulatory O2 needs assessment prior to discharge  - monitor intermittent CXR   CAD  - pending stress myoview per Cardiology    Canary Brim, NP-C [facility] Pgr: [phone] or if no answer [phone] 12/26/2015, 2:35 PM   ATTENDING NOTE / ATTESTATION NOTE :   I have discussed the case with the resident/APP  Canary Brim.   I agree with the resident/APP's  history, physical examination, assessment, and plans.    I have edited the above note and modified it according to our agreed history, physical examination, assessment and plan.   Briefly, pt admitted for aneurysm ( Infrarenal fusiform abdominal aortic aneurysm measuring 8 cm, transverse without evidence of acute intramural hematoma or aortic leak. Cardiology has been consulted. 2DEcho with no RWMA, elevated PASP in the 70s. Pt with 20 PY smoking history, quit in the 70s. Not known to have copd or asthma. Denies previous lung dse. Did upholstery work mostly. (-) other assoc chemical exposure.  (-) h/o DVT/PE. Denies sx of OSA although he has crowded airway.   PE as above. Comfortable. VSS. 94% on 2L. Crowded airway. (-) NVD. Not in distress, comfortable.  Crackle mid-bases. (-) wheezing. Good s1/s2. (-) m. (+) BS, soft. (-) masses. (+) clubbing.   Labs reviewed. Chest ct scan with B GGL infiltrates, maybe some subpleural fibrosis. Creat 2.3  Assessment : Likely with PULM HTN, multifactorial. Related to Hypoxemia with underlying ILD, possible COPD.  Less likely OSA but can not R/O altogether.  Less likely chronic VTE but CTA is reassuring. Possible pulm venous HTN 2/2 Left Hrt dse.   Plan : 1. I extensively d/w pt and wife re: pulm HTN and work up.  It should  not preclude pt from getting EVAR as he has increased chances of leaking aneurysm.  He is at increased risk for pulm Cx post op given pulm HTN but they understand risks and would want to pursue surgery.  2. Pt will need PFT.  3. Pt needs ABG.  4. Ideally, will need a sleep study to R/O OSA > can do as outpt. (OSA less likely though) 5. Will need to R/O chronic PE (although less likely) with VQ scan BUT OAC is on hold 2/2 aneurysm.  Can be done later on as an outpt.  6. Do CTD work up for ILD.  7. Ideally, will need a RHC to diagnose pulm HTN. Cardiology is following.    Family :Family updated at length today.  Plan d/w pt and wife.    Pollie Meyer, MD 12/26/2015, 4:32 PM [facility] Pager [phone] After 3 pm or if no answer, call [phone]

## 2015-12-26 NOTE — Progress Notes (Signed)
Patient Name: [name]. Date of Encounter: 12/26/2015  Active Problems:   Chronic kidney disease (CKD), stage IV (severe) (HCC)   AAA (abdominal aortic aneurysm) (HCC)   Length of Stay: 1  SUBJECTIVE  SOB on minimal exertion, on O2 with [place], SpO2 88% without oxygen.  CURRENT MEDS . albuterol  2.5 mg Nebulization TID  . allopurinol  300 mg Oral Daily  . aspirin EC  81 mg Oral Daily  . calcium acetate  667 mg Oral BID WC  . loratadine  10 mg Oral QHS  . metoprolol succinate  50 mg Oral QHS  . multivitamin  1 tablet Oral QHS  . rosuvastatin  20 mg Oral Daily  . sodium bicarbonate  1,300 mg Oral BID   OBJECTIVE  Vitals:   12/25/15 1915 12/25/15 2348 12/26/15 0555 12/26/15 0749  BP: 109/69 (!) 116/58 119/75   Pulse: 62 64 64   Resp: 18  18   Temp: 98.1 F (36.7 C)  97.7 F (36.5 C)   TempSrc: Oral  Oral   SpO2: 98% 95% 95% 94%  Weight:      Height:        Intake/Output Summary (Last 24 hours) at 12/26/15 1216 Last data filed at 12/25/15 2348  Gross per 24 hour  Intake                0 ml  Output              250 ml  Net             -250 ml   Filed Weights   12/23/15 1448 12/23/15 2029 12/24/15 0600  Weight: 162 lb (73.5 kg) 159 lb 14.4 oz (72.5 kg) 163 lb 9.6 oz (74.2 kg)    PHYSICAL EXAM  General: Pleasant, NAD. Neuro: Alert and oriented X 3. Moves all extremities spontaneously. Psych: Normal affect. HEENT:  Normal  Neck: Supple without bruits, elevated JVDs + 8 cm B/L Lungs:  Resp regular and unlabored, CTA. Heart: RRR no s3, s4, 4/6 systolic murmurs. Abdomen: Soft, non-tender, non-distended, BS + x 4.  Extremities: No clubbing, cyanosis or edema. DP/PT/Radials 2+ and equal bilaterally.  Accessory Clinical Findings  CBC  Recent Labs  12/25/15 0423 12/26/15 0822  WBC 8.0 8.9  NEUTROABS 5.5 6.1  HGB 16.3 18.8*  HCT 48.4 55.3*  MCV 98.4 98.0  PLT 118* 107*   Basic Metabolic Panel  Recent Labs  12/25/15 0423 12/26/15 0822  NA  140 138  K 4.6 4.7  CL 115* 109  CO2 18* 16*  GLUCOSE 77 89  BUN 21* 27*  CREATININE 2.04* 2.32*  CALCIUM 9.1 9.6  MG 2.0 2.2  PHOS 2.3* 2.5   Liver Function Tests  Recent Labs  12/25/15 0423 12/26/15 0822  AST 21 30  ALT 7* 9*  ALKPHOS 53 64  BILITOT 0.8 1.0  PROT 5.3* 6.5  ALBUMIN 3.3* 4.1   Radiology/Studies  Ct Angio Chest/abd/pel For Dissection W And/or W/wo  Result Date: 12/23/2015 CLINICAL DATA:  Abdominal aortic aneurysm with worsening back pain.  IMPRESSION: 1. Infrarenal fusiform abdominal aortic aneurysm measuring 8 cm transverse without evidence of acute intramural hematoma or aortic leak. Developing calcifications seen within the soft plaque along the left lateral aspect of this aneurysm. The aneurysm is 3.5 cm from the renal arteries it measures approximately 2.2 cm in caliber. The aneurysm terminates at the aortic bifurcation. There is ectasia of the right common iliac artery  to 1.9 cm in and more aneurysmal dilatation of the left common iliac artery to 2.9 cm with plaque noted. The external iliac arteries are patent measuring 0.6 cm on the right and 0.8 cm on the left. 2. Numerous hepatic and bilateral renal hypodensities consistent with cysts. Atrophic appearing kidneys bilaterally. 3. No pulmonary embolus or thoracic aortic aneurysm. No aortic dissection. All 4.  Pulmonary emphysema. 5. No acute thoracic abnormality. Chronic appearing T7 compression.   TELE: [name]  TTE: 12/25/15 - Left ventricle: The cavity size was normal. Wall thickness was   normal. Systolic function was normal. The estimated ejection   fraction was in the range of 55% to 60%. Wall motion was normal;   there were no regional wall motion abnormalities. Doppler   parameters are consistent with abnormal left ventricular   relaxation (grade 1 diastolic dysfunction). - Aortic root: The aortic root was mildly dilated. - Pulmonary arteries: Systolic pressure was severely increased. PA   peak  pressure: 70 mm Hg (S).  Impressions: - Normal LV systolic function; grade 1 diastolic dysfunction;   mildly dilated aortic root; trace MR; mild TR with severely   elevated pulmonary pressure.    ASSESSMENT AND PLAN  81 year old male with a large AAA, schedule for a surgery on 01/09/16, with normal LVEF, grade 1 diastolic dysfunction, but severe pulonary hypertension. After reviewing his Chest CT - all sec to COPD, he is no treatment like home O2, Spiriva, I would recommend to call pulmonary to optimize him before surgery.  Chest CT also showed 3 vessel coronary calcifications, he reports SOB but has severe pulmonary hypertension - we will plan for a stress test tomorrow.   Signed, Tobias AlexanderKatarina Aceton Kinnear MD, [facility] 12/26/2015

## 2015-12-26 NOTE — Progress Notes (Signed)
PROGRESS NOTE    Kenneth MinaDavid G Laperle Sr.  QMV:[id number] DOB: 09/09/1934 DOA: 12/23/2015 PCP: Simone CuriaLEE,[name], MD  Brief Narrative: Kenneth Pyoavid G Clauss Sr. is a 81 y.o. male with medical history significant of chronic kidney disease stage IV, hypertension, hyperlipidemia. Patient was having back pain and his PCP sent him for an MRI of his back. Patient had returned home after the MRI when he was called by radiology and told to come to the ER for an incidental finding of a thoracic aortic aneurysm. Patient's back pain previously had been associated with degenerative disc disease with radicular symptoms in his legs. Patient had a CTA in the ER of the chest abdomen and pelvis. He was found to have a large infrarenal AAA that may be compatible for EVAR repair. He was seen by Dr. [name] in the ER suggested that patient be watched in the hospital after receiving IV contrast with chronic kidney disease stage IV. Per patient his normal GFR ranges between 15-20. He follows with [name] on a regular basis.  Per vascular they would like a Cardiology consult to get a preop evaluation. Tentatively surgery is planned for December 4. Cardiology came to evaluate the patient and are concerned that his dyspnea may have a cardiac component as opposed to just his chronic Emphysemetatous Disease and Right Heart Failure may be may be related to Pulmonary HTN from his pulmonary Disease or 2/2 to Left-Sided Disease so is undergoing an Echocardiogram and possible Myoview Stress Test. ECHO was done today and PT evaluated the patient and patient required O2 via [place] for Ambulation. Cardiology will perform a MyoView Stress test in AM and Pulmonary was consulted for Pre-Op Evaluation as well.  Assessment & Plan:   Active Problems:   Chronic kidney disease (CKD), stage IV (severe) (HCC)   AAA (abdominal aortic aneurysm) (HCC)  AAA -Seen by Vascular- Dr. [name] -Per Dr. Nicky Pughhen's note There is no evidence of rupture on the CTA despite the MRI  findings.  +There is NO indicate for emergent repair of this large AAA.  +Due to his CKD IV and contrast load needed for the CTA, I recommend admission to Hospitalist service for IV hydration and observation for possible contrast induced nephropathy leading to ARF.  +While admitted, would get Cardiac consultation to expedite preop evaluation.  +Will get the CTA reviewed with my [employer] rep on Monday as I suspect his anatomy is outside of the IFU for the C3 device. I still suspect it can be repaired with such.  +Would subsequently plan on proceeding with EVAR the week of 4th of Dec.  -Cardiology Evaluated and concerned about patient's dyspnea and may have a cardiac component as opposed to just chronic Emphysemetous disease and Right Heart Failure (evidenced by JVP on Cardiology Exam) may be related to Pulmonary HTN or Left Sided Heart Disease -ECHOcardiogram done today and showed Normal LV systolic function; grade 1 diastolic dysfunction; mildly dilated aortic root; trace MR; mild TR with severely  elevated pulmonary pressure. -Spoke with Cardiology Dr. Tobias AlexanderKatarina Nelson and patient is to undergo a Myoview Stress Test in Am. Also recommended Pulmonary Consult to optimize him prior to Surgery -Pulmonary Dr. [name] consulted for Pulmonary Optimization - Recommended PFT's, ABG, and Ideally a sleep study to r/o OSA and need to r/o Chronic PE with V/Q Scan. Will do a Connective Tissue Disorders for ILD and Ideally need a RHC. - Will Defer to Pulmonary for full workup.   CKD-stage IV -follows with [name] -  Has fistula that is ready for possible dialysis -IVF as patient got a contrast load with CTA; D/C'd IVF now -Patients BUN/Cr went from 25/2.09 -> 23/1.97 -> 21/2.04 -> 27/2.32 -C/w Sodium Bicarbonate 1300 mg po BID, Calcium Acetate po 667, and with Rena-Vit 1 tab po qHS -Repeat BMP in AM  HLD -C/w Rosovastatin 20 mg po Daily   GOUT -C/w Allopurinol 300 mg po Daily  Generalized  Weakness and Deconditioning with Increased Back Pain causing difficulty in walking -Discussed with Neurosurgery [name] and states no indication for inpatient evaluation and to follow up as an outpatient -Hx of Herniated Disks -PT to Evaluate and Treat -> Recommended Home Health PT  Acute Hypoxic Respiratory Failure suspect from Pulmonary HTN -Room Air Saturations were 88% -Scheduled Albuterol Nebs q6h instead of prn -CXR in AM -Patient improved with O2 via [place] -Walk Screen qualifies patient for Home O2 -Likely from Pulmonary Hypertension as PA Pressure was 70. -Pulmonary Evaluation started and possible Right Heart Cath - Will Defer to pulmonary.   T7 Compression Fracture -Continue with Physical Therapy -Pain Control with Acetaminophen  DVT prophylaxis: SCD's Code Status: FULL Family Communication: Discussed Plan of Care with Wife at bedside Disposition Plan: Pending Cardiology Recc's  Consultants:   Vascular Surgery  Cardiology  Pulmonology  Procedures: ECHOcardiogram, Myoview in Am.  Antimicrobials: None  Subjective: Seen and examined at bedside and was doing well. No CP but states O2 helps tremendously. No N/V or Abdominal Pain. No other complaints.    Objective: Vitals:   12/25/15 1915 12/25/15 2348 12/26/15 0555 12/26/15 0749  BP: 109/69 (!) 116/58 119/75   Pulse: 62 64 64   Resp: 18  18   Temp: 98.1 F (36.7 C)  97.7 F (36.5 C)   TempSrc: Oral  Oral   SpO2: 98% 95% 95% 94%  Weight:      Height:        Intake/Output Summary (Last 24 hours) at 12/26/15 1429 Last data filed at 12/25/15 2348  Gross per 24 hour  Intake                0 ml  Output              250 ml  Net             -250 ml   Filed Weights   12/23/15 1448 12/23/15 2029 12/24/15 0600  Weight: 73.5 kg (162 lb) 72.5 kg (159 lb 14.4 oz) 74.2 kg (163 lb 9.6 oz)    Examination: Physical Exam:  Constitutional: WN/WD, NAD and appears calm  Eyes: Lids and conjunctivae normal, sclerae  anicteric  ENMT: External Ears, Nose appear normal. Grossly normal hearing. Neck: Appears normal, supple, no cervical masses, normal ROM, no appreciable thyromegaly.  Respiratory: Diminished auscultation bilaterally, no wheezing, rales, rhonchi or crackles. Normal respiratory effort and patient is not tachypenic. No accessory muscle use but wearing O2 via [place].  Cardiovascular: RRR, Slight 2/6 Sysotlic Murmur. No rubs / gallops. S1 and S2 auscultated. No extremity edema. Abdomen: Soft, non-tender, non-distended. No masses palpated. No appreciable hepatosplenomegaly. Bowel sounds positive x4.  GU: Deferred. Musculoskeletal: No clubbing / cyanosis of digits/nails. No joint deformity upper and lower extremities. No contractures. Decreased Strength 4/5 in [place].  Skin: No rashes, lesions, ulcers. No induration; Warm and dry.  Neurologic: CN 2-12 grossly intact with no focal deficits. Sensation intact in all 4 Extremities. Romberg sign cerebellar reflexes not assessed.  Psychiatric: Normal judgment and insight. Alert and oriented  x 3. Normal mood and appropriate affect.   Data Reviewed: I have personally reviewed following labs and imaging studies  CBC:  Recent Labs Lab 12/23/15 1445 12/24/15 0311 12/25/15 0423 12/26/15 0822  WBC 10.6* 7.1 8.0 8.9  NEUTROABS 7.6  --  5.5 6.1  HGB 18.8* 15.3 16.3 18.8*  HCT 54.8* 45.6 48.4 55.3*  MCV 98.4 98.5 98.4 98.0  PLT 161 125* 118* 107*   Basic Metabolic Panel:  Recent Labs Lab 12/23/15 1645 12/24/15 0311 12/25/15 0423 12/26/15 0822  NA 138 140 140 138  K 4.2 4.5 4.6 4.7  CL 112* 114* 115* 109  CO2 20* 19* 18* 16*  GLUCOSE 92 91 77 89  BUN 25* 23* 21* 27*  CREATININE 2.09* 1.97* 2.04* 2.32*  CALCIUM 9.6 8.8* 9.1 9.6  MG 2.2  --  2.0 2.2  PHOS  --   --  2.3* 2.5   GFR: Estimated Creatinine Clearance: 23.3 mL/min (by C-G formula based on SCr of 2.32 mg/dL (H)). Liver Function Tests:  Recent Labs Lab 12/23/15 1645 12/25/15 0423  12/26/15 0822  AST 22 21 30   ALT 9* 7* 9*  ALKPHOS 59 53 64  BILITOT 0.8 0.8 1.0  PROT 6.2* 5.3* 6.5  ALBUMIN 3.7 3.3* 4.1   No results for input(s): LIPASE, AMYLASE in the last 168 hours. No results for input(s): AMMONIA in the last 168 hours. Coagulation Profile:  Recent Labs Lab 12/23/15 1445  INR 1.03   Cardiac Enzymes: No results for input(s): CKTOTAL, CKMB, CKMBINDEX, TROPONINI in the last 168 hours. BNP (last 3 results) No results for input(s): PROBNP in the last 8760 hours. HbA1C: No results for input(s): HGBA1C in the last 72 hours. CBG: No results for input(s): GLUCAP in the last 168 hours. Lipid Profile: No results for input(s): CHOL, HDL, LDLCALC, TRIG, CHOLHDL, LDLDIRECT in the last 72 hours. Thyroid Function Tests: No results for input(s): TSH, T4TOTAL, FREET4, T3FREE, THYROIDAB in the last 72 hours. Anemia Panel: No results for input(s): VITAMINB12, FOLATE, FERRITIN, TIBC, IRON, RETICCTPCT in the last 72 hours. Sepsis Labs: No results for input(s): PROCALCITON, LATICACIDVEN in the last 168 hours.  No results found for this or any previous visit (from the past 240 hour(s)).   Radiology Studies: No results found. Scheduled Meds: . albuterol  2.5 mg Nebulization TID  . allopurinol  300 mg Oral Daily  . aspirin EC  81 mg Oral Daily  . calcium acetate  667 mg Oral BID WC  . loratadine  10 mg Oral QHS  . metoprolol succinate  50 mg Oral QHS  . multivitamin  1 tablet Oral QHS  . rosuvastatin  20 mg Oral Daily  . sodium bicarbonate  1,300 mg Oral BID   Continuous Infusions:   LOS: 1 day   Merlene Laughtermair Latif Latravis Grine, DO [facility] Pager [phone]  If 7PM-7AM, please contact night-coverage [email or web address] Password [email or web address] 12/26/2015, 2:29 PM

## 2015-12-26 NOTE — Progress Notes (Addendum)
[facility]  Subjective  - Comfortable.  Wants to go home as soon as possible.   Objective 119/75 64 97.7 F (36.5 C) (Oral) 18 95%  Intake/Output Summary (Last 24 hours) at 12/26/15 0733 Last data filed at 12/25/15 2348  Gross per 24 hour  Intake                0 ml  Output              250 ml  Net             -250 ml    Feet warm well perfused Abdomin soft NTTP, palpable aortic pulse Right arm av fistula with thrill  Assessment/Planning: POD # AAA  1. Infrarenal fusiform abdominal aortic aneurysm measuring 8 cm transverse without evidence of acute intramural hematoma or aortic leak. Developing calcifications seen within the soft plaque along the left lateral aspect of this aneurysm. The aneurysm is 3.5 cm from the renal arteries it measures approximately 2.2 cm in caliber. The aneurysm terminates at the aortic bifurcation. There is ectasia of the right common iliac artery to 1.9 cm in and more aneurysmal dilatation of the left common iliac artery to 2.9 cm with plaque noted. The external iliac arteries are patent measuring 0.6 cm on the right and 0.8 cm on the left.  Plan for Open verse EVAR repair per Dr. [name] and cardiology work up.  Clinton GallantCOLLINS, EMMA [facility] HospitalMAUREEN 12/26/2015 7:33 AM --  Laboratory Lab Results:  Recent Labs  12/24/15 0311 12/25/15 0423  WBC 7.1 8.0  HGB 15.3 16.3  HCT 45.6 48.4  PLT 125* 118*   BMET  Recent Labs  12/24/15 0311 12/25/15 0423  NA 140 140  K 4.5 4.6  CL 114* 115*  CO2 19* 18*  GLUCOSE 91 77  BUN 23* 21*  CREATININE 1.97* 2.04*  CALCIUM 8.8* 9.1    COAG Lab Results  Component Value Date   INR 1.03 12/23/2015   No results found for: PTT  Addendum  I have independently interviewed and examined the patient, and I agree with the physician assistant's findings.  Reviewed echo.  Appears intact EF with pulmn HTN.  CTA reviewed with device rep.  Appears compatible with repair with C3  device.  Will aim for next Tuesday 01/03/16.  Time dependent on availability of hybrid OR next Tuesday.    - Awaiting completion of Cardiology work-up - Have to order pieces for the EVAR, so will have wait until next Tuesday to do the repair - In this patient, he has stated that he will request repair of his AAA in the event of emergent rupture, subsequently, regardless of the risk stratification an attempt at repair will occur, preferably in a controlled elective fashion given his annual rupture risk is >50% for a >8 cm AAA. - Subsequently, would be best to optimize his cardiac status and proceed with the EVAR next Tuesday.  Leonides SakeBrian Damaria Vachon, MD, FACS [facility] [place] Office: [phone] Pager: [phone]  12/26/2015, 11:13 AM

## 2015-12-27 ENCOUNTER — Inpatient Hospital Stay (HOSPITAL_COMMUNITY): Payer: Medicare Other

## 2015-12-27 ENCOUNTER — Encounter (HOSPITAL_COMMUNITY)
Admission: EM | Disposition: A | Discharge status: Home Health | Payer: Self-pay | Source: Home / Self Care | Attending: Internal Medicine

## 2015-12-27 DIAGNOSIS — N184 Chronic kidney disease, stage 4 (severe): Secondary | ICD-10-CM

## 2015-12-27 DIAGNOSIS — I2510 Atherosclerotic heart disease of native coronary artery without angina pectoris: Secondary | ICD-10-CM

## 2015-12-27 DIAGNOSIS — I2721 Secondary pulmonary arterial hypertension: Secondary | ICD-10-CM

## 2015-12-27 DIAGNOSIS — R0609 Other forms of dyspnea: Secondary | ICD-10-CM

## 2015-12-27 DIAGNOSIS — M5440 Lumbago with sciatica, unspecified side: Secondary | ICD-10-CM

## 2015-12-27 DIAGNOSIS — R9439 Abnormal result of other cardiovascular function study: Secondary | ICD-10-CM

## 2015-12-27 HISTORY — PX: CARDIAC CATHETERIZATION: SHX172

## 2015-12-27 LAB — BLOOD GAS, ARTERIAL
ACID-BASE DEFICIT: 3.8 mmol/L — AB (ref 0.0–2.0)
BICARBONATE: 20 mmol/L (ref 20.0–28.0)
DRAWN BY: 418751
FIO2: 21
O2 SAT: 92.1 %
PCO2 ART: 31.9 mmHg — AB (ref 32.0–48.0)
PH ART: 7.414 (ref 7.350–7.450)
Patient temperature: 98.6
pO2, Arterial: 62 mmHg — ABNORMAL LOW (ref 83.0–108.0)

## 2015-12-27 LAB — COMPREHENSIVE METABOLIC PANEL
ALBUMIN: 3.4 g/dL — AB (ref 3.5–5.0)
ALT: 7 U/L — AB (ref 17–63)
AST: 17 U/L (ref 15–41)
Alkaline Phosphatase: 55 U/L (ref 38–126)
Anion gap: 7 (ref 5–15)
BUN: 30 mg/dL — AB (ref 6–20)
CHLORIDE: 111 mmol/L (ref 101–111)
CO2: 21 mmol/L — AB (ref 22–32)
CREATININE: 2.25 mg/dL — AB (ref 0.61–1.24)
Calcium: 9.2 mg/dL (ref 8.9–10.3)
GFR calc Af Amer: 30 mL/min — ABNORMAL LOW (ref >60)
GFR calc non Af Amer: 26 mL/min — ABNORMAL LOW (ref >60)
GLUCOSE: 91 mg/dL (ref 65–99)
Potassium: 4.6 mmol/L (ref 3.5–5.1)
SODIUM: 139 mmol/L (ref 135–145)
Total Bilirubin: 0.6 mg/dL (ref 0.3–1.2)
Total Protein: 5.4 g/dL — ABNORMAL LOW (ref 6.5–8.1)

## 2015-12-27 LAB — NM MYOCAR MULTI W/SPECT W/WALL MOTION / EF
Estimated workload: 1 METS
Exercise duration (min): 5 min
Exercise duration (sec): 17 s
LV dias vol: 64 mL (ref 62–150)
LV sys vol: 22 mL
Peak HR: 82 {beats}/min
RATE: 0
Rest HR: 65 {beats}/min
SDS: 3
SRS: 3
SSS: 6
TID: 1.05

## 2015-12-27 LAB — CBC WITH DIFFERENTIAL/PLATELET
BASOS ABS: 0.1 10*3/uL (ref 0.0–0.1)
Basophils Relative: 1 %
EOS ABS: 0.6 10*3/uL (ref 0.0–0.7)
EOS PCT: 7 %
HCT: 49.7 % (ref 39.0–52.0)
Hemoglobin: 16.7 g/dL (ref 13.0–17.0)
Lymphocytes Relative: 15 %
Lymphs Abs: 1.3 10*3/uL (ref 0.7–4.0)
MCH: 33 pg (ref 26.0–34.0)
MCHC: 33.6 g/dL (ref 30.0–36.0)
MCV: 98.2 fL (ref 78.0–100.0)
Monocytes Absolute: 0.9 10*3/uL (ref 0.1–1.0)
Monocytes Relative: 11 %
NEUTROS PCT: 66 %
Neutro Abs: 5.7 10*3/uL (ref 1.7–7.7)
PLATELETS: 123 10*3/uL — AB (ref 150–400)
RBC: 5.06 MIL/uL (ref 4.22–5.81)
RDW: 15.4 % (ref 11.5–15.5)
WBC: 8.6 10*3/uL (ref 4.0–10.5)

## 2015-12-27 LAB — POCT I-STAT 3, ART BLOOD GAS (G3+)
Acid-base deficit: 5 mmol/L — ABNORMAL HIGH (ref 0.0–2.0)
Bicarbonate: 19 mmol/L — ABNORMAL LOW (ref 20.0–28.0)
O2 Saturation: 91 %
PCO2 ART: 32.3 mmHg (ref 32.0–48.0)
PH ART: 7.379 (ref 7.350–7.450)
TCO2: 20 mmol/L (ref 0–100)
pO2, Arterial: 62 mmHg — ABNORMAL LOW (ref 83.0–108.0)

## 2015-12-27 LAB — PHOSPHORUS: PHOSPHORUS: 2.9 mg/dL (ref 2.5–4.6)

## 2015-12-27 LAB — POCT ACTIVATED CLOTTING TIME
ACTIVATED CLOTTING TIME: 224 s
ACTIVATED CLOTTING TIME: 180 s

## 2015-12-27 LAB — MAGNESIUM: Magnesium: 2.2 mg/dL (ref 1.7–2.4)

## 2015-12-27 SURGERY — RIGHT/LEFT HEART CATH AND CORONARY ANGIOGRAPHY

## 2015-12-27 MED ORDER — ENOXAPARIN SODIUM 30 MG/0.3ML ~~LOC~~ SOLN: 30.0000 mg | SUBCUTANEOUS | Status: DC

## 2015-12-27 MED ORDER — TECHNETIUM TC 99M TETROFOSMIN IV KIT
30.0000 | PACK | Freq: Once | INTRAVENOUS | Status: AC | PRN
  Administered 2015-12-27: 30 via INTRAVENOUS

## 2015-12-27 MED ORDER — REGADENOSON 0.4 MG/5ML IV SOLN
INTRAVENOUS | Status: AC
  Filled 2015-12-27: qty 5

## 2015-12-27 MED ORDER — VERAPAMIL HCL 2.5 MG/ML IV SOLN
INTRAVENOUS | Status: DC | PRN
  Administered 2015-12-27: 10 mL via INTRA_ARTERIAL

## 2015-12-27 MED ORDER — FENTANYL CITRATE (PF) 100 MCG/2ML IJ SOLN
INTRAMUSCULAR | Status: DC | PRN
  Administered 2015-12-27: 25 ug via INTRAVENOUS

## 2015-12-27 MED ORDER — ASPIRIN 81 MG PO CHEW: 81.0000 mg | CHEWABLE_TABLET | Freq: Once | ORAL | Status: AC

## 2015-12-27 MED ORDER — SODIUM CHLORIDE 0.9% FLUSH: 3.0000 mL | INTRAVENOUS | Status: DC | PRN

## 2015-12-27 MED ORDER — NITROGLYCERIN 1 MG/10 ML FOR IR/CATH LAB
INTRA_ARTERIAL | Status: AC
  Filled 2015-12-27: qty 10

## 2015-12-27 MED ORDER — HEPARIN (PORCINE) IN NACL 2-0.9 UNIT/ML-% IJ SOLN
INTRAMUSCULAR | Status: DC | PRN
Start: 2015-12-27 — End: 2015-12-27
  Administered 2015-12-27: 1000 mL

## 2015-12-27 MED ORDER — IOPAMIDOL (ISOVUE-370) INJECTION 76%
INTRAVENOUS | Status: AC
  Filled 2015-12-27: qty 50

## 2015-12-27 MED ORDER — MIDAZOLAM HCL 2 MG/2ML IJ SOLN
INTRAMUSCULAR | Status: AC
  Filled 2015-12-27: qty 2

## 2015-12-27 MED ORDER — ASPIRIN EC 81 MG PO TBEC: 81.0000 mg | DELAYED_RELEASE_TABLET | Freq: Every day | ORAL | Status: DC

## 2015-12-27 MED ORDER — SODIUM CHLORIDE 0.9% FLUSH
3.0000 mL | Freq: Two times a day (BID) | INTRAVENOUS | Status: DC
  Administered 2015-12-27 – 2015-12-29 (×4): 3 mL via INTRAVENOUS

## 2015-12-27 MED ORDER — LIDOCAINE HCL (PF) 1 % IJ SOLN
INTRAMUSCULAR | Status: AC
  Filled 2015-12-27: qty 30

## 2015-12-27 MED ORDER — VERAPAMIL HCL 2.5 MG/ML IV SOLN
INTRAVENOUS | Status: AC
  Filled 2015-12-27: qty 2

## 2015-12-27 MED ORDER — TECHNETIUM TC 99M TETROFOSMIN IV KIT
10.0000 | PACK | Freq: Once | INTRAVENOUS | Status: AC | PRN
  Administered 2015-12-27: 10 via INTRAVENOUS

## 2015-12-27 MED ORDER — REGADENOSON 0.4 MG/5ML IV SOLN
0.4000 mg | Freq: Once | INTRAVENOUS | Status: AC
  Administered 2015-12-27: 0.4 mg via INTRAVENOUS
  Filled 2015-12-27: qty 5

## 2015-12-27 MED ORDER — HEPARIN SODIUM (PORCINE) 1000 UNIT/ML IJ SOLN
INTRAMUSCULAR | Status: AC
  Filled 2015-12-27: qty 1

## 2015-12-27 MED ORDER — FENTANYL CITRATE (PF) 100 MCG/2ML IJ SOLN
INTRAMUSCULAR | Status: AC
  Filled 2015-12-27: qty 2

## 2015-12-27 MED ORDER — LIDOCAINE HCL (PF) 1 % IJ SOLN
INTRAMUSCULAR | Status: DC | PRN
  Administered 2015-12-27: 2 mL
  Administered 2015-12-27: 20 mL

## 2015-12-27 MED ORDER — SODIUM CHLORIDE 0.9 % IV SOLN: 250.0000 mL | INTRAVENOUS | Status: DC | PRN

## 2015-12-27 MED ORDER — SODIUM CHLORIDE 0.9 % IV SOLN: INTRAVENOUS | Status: AC

## 2015-12-27 MED ORDER — IOPAMIDOL (ISOVUE-370) INJECTION 76%
INTRAVENOUS | Status: DC | PRN
  Administered 2015-12-27: 40 mL via INTRA_ARTERIAL

## 2015-12-27 MED ORDER — HEPARIN (PORCINE) IN NACL 2-0.9 UNIT/ML-% IJ SOLN
INTRAMUSCULAR | Status: AC
  Filled 2015-12-27: qty 1000

## 2015-12-27 MED ORDER — HEPARIN SODIUM (PORCINE) 1000 UNIT/ML IJ SOLN
INTRAMUSCULAR | Status: DC | PRN
  Administered 2015-12-27: 3500 [IU] via INTRAVENOUS

## 2015-12-27 MED ORDER — ONDANSETRON HCL 4 MG/2ML IJ SOLN: 4.0000 mg | Freq: Four times a day (QID) | INTRAMUSCULAR | Status: DC | PRN

## 2015-12-27 MED ORDER — ACETAMINOPHEN 325 MG PO TABS: 650.0000 mg | ORAL_TABLET | ORAL | Status: DC | PRN

## 2015-12-27 MED ORDER — MIDAZOLAM HCL 2 MG/2ML IJ SOLN
INTRAMUSCULAR | Status: DC | PRN
  Administered 2015-12-27: 1 mg via INTRAVENOUS

## 2015-12-27 MED ORDER — HYDRALAZINE HCL 20 MG/ML IJ SOLN
INTRAMUSCULAR | Status: DC | PRN
  Administered 2015-12-27: 10 mg via INTRAVENOUS

## 2015-12-27 SURGICAL SUPPLY — 12 items
CATH INFINITI 4FR JL 4.0 (CATHETERS) ×3 IMPLANT
CATH INFINITI 4FR RCB (CATHETERS) ×3 IMPLANT
CATH SWAN GANZ 7F STRAIGHT (CATHETERS) ×3 IMPLANT
GLIDESHEATH SLEND SS 6F .021 (SHEATH) ×6 IMPLANT
GUIDEWIRE INQWIRE 1.5J.035X260 (WIRE) ×1 IMPLANT
INQWIRE 1.5J .035X260CM (WIRE) ×3
KIT HEART LEFT (KITS) ×3 IMPLANT
PACK CARDIAC CATHETERIZATION (CUSTOM PROCEDURE TRAY) ×3 IMPLANT
SHEATH PINNACLE 7F 10CM (SHEATH) ×3 IMPLANT
TRANSDUCER W/STOPCOCK (MISCELLANEOUS) ×3 IMPLANT
TUBING CIL FLEX 10 FLL-RA (TUBING) ×3 IMPLANT
WIRE HI TORQ VERSACORE-J 145CM (WIRE) ×3 IMPLANT

## 2015-12-27 NOTE — Progress Notes (Addendum)
PROGRESS NOTE    [name]  ZOX:[id number] DOB: [birth year] DOA: 12/23/2015 PCP: [name], MD  Brief Narrative: Kenneth Pyo Sr. is a 81 y.o. male with medical history significant of chronic kidney disease stage IV, hypertension, hyperlipidemia. Patient was having back pain and his PCP sent him for an MRI of his back. Patient had returned home after the MRI when he was called by radiology and told to come to the ER for an incidental finding of a thoracic aortic aneurysm. Patient's back pain previously had been associated with degenerative disc disease with radicular symptoms in his legs. Patient had a CTA in the ER of the chest abdomen and pelvis. He was found to have a large infrarenal AAA that may be compatible for EVAR repair. He was seen by Dr. [name] in the ER suggested that patient be watched in the hospital after receiving IV contrast with chronic kidney disease stage IV. Per patient his normal GFR ranges between 15-20. He follows with Dr. [name] on a regular basis.  Per vascular they would like a Cardiology consult to get a preop evaluation. Tentatively surgery is planned for December 4. Cardiology came to evaluate the patient and are concerned that his dyspnea may have a cardiac component as opposed to just his chronic Emphysemetatous Disease and Right Heart Failure may be may be related to Pulmonary HTN from his pulmonary Disease or 2/2 to Left-Sided Disease so is undergoing an Echocardiogram and possible Myoview Stress Test. ECHO was done and PT evaluated the patient and patient required O2 via [place] for Ambulation. Cardiology will perform a MyoView Stress test iand Pulmonary was consulted for Pre-Op Evaluation as well. After MyoView patient underwent Left and Right Heart Catheterization.  Assessment & Plan:   Active Problems:   Stage 4 chronic kidney disease (HCC)   Abdominal aortic aneurysm (AAA) without rupture (HCC)   Pulmonary hypertension   DOE (dyspnea on exertion)   Muscle  weakness (generalized)   Acute right-sided low back pain with sciatica  1. AAA -Seen by Vascular- Dr. [name] -Per Dr. Nicky Pugh note There is no evidence of rupture on the CTA despite the MRI findings.  +There is NO indicate for emergent repair of this large AAA.  +Due to his CKD IV and contrast load needed for the CTA, I recommend admission to Hospitalist service for IV hydration and observation for possible contrast induced nephropathy leading to ARF.  +While admitted, would get Cardiac consultation to expedite preop evaluation.  +Will get the CTA reviewed with my [employer] rep on Monday as I suspect his anatomy is outside of the IFU for the C3 device. I still suspect it can be repaired with such.  +Would subsequently plan on proceeding with EVAR the week of 4th of Dec.   -Cardiology Evaluated and concerned about patient's dyspnea and may have a cardiac component as opposed to just chronic Emphysemetous disease and Right Heart Failure (evidenced by JVP on Cardiology Exam) may be related to Pulmonary HTN or Left Sided Heart Disease  -ECHOcardiogram done and showed Normal LV systolic function; grade 1 diastolic dysfunction; mildly dilated aortic root; trace MR; mild TR with severely  elevated pulmonary pressure.   -Spoke with Cardiology [name] yesterdayand patient underwent a Myoview Stress Test this AM which showed:   There was no ST segment deviation noted during stress.  No T wave inversion was noted during stress.  Defect 1: There is a defect present in the basal inferolateral, mid inferolateral and apical lateral  location.  Findings consistent with ischemia.  This is an intermediate risk study.  The left ventricular ejection fraction is normal (55-65%).   There is a medium size, moderate severity reversible defect in the basal and mid inferolateral and apical lateral walls consistent with ischemia (SDS =4).  -Because of Abnormal Stress Test Patient underwent Left and Right  Heart Catheterization  CARDIAC CATHERIZATION SHOWED: Assessment: 1. Severe 3v CAD not amenable to PCI 2. Mild PAH with normal cardiac output and no evidence of RV strain 3. Normal LV function by echo  -TCTS to Assess prior to EVAR; If not felt to be a candidate for CABG would Proced with CABG and Proceed with Medical Therapy and High-Risk EVAR. -Will need to Consult if not notified by Cardiology.  -*Pulmonary Dr. Delphina CahillJose Angelo A de [facility]CDios consulted for Pulmonary Optimization - Recommended PFT's, ABG, and Ideally a sleep study to r/o OSA and need to r/o Chronic PE with V/Q Scan. Pulmonology to dor Connective Tissue Disease workup for ILD- Currently Pending.   2. Severe 3 Vessel Coronary Artery Disease not Amenable to CABG -Will need Cardiothroacic Surgery Evaluation -As Above -C/w ASA 81 mg and Rousovastatin 20 mg po qHS  3. CKD-stage IV -follows with [name] -Has fistula that is ready for possible dialysis -IVF as patient got a contrast load with Heart Cath; C/w NS 75 mL/hr per Protocol -Patients BUN/Cr went from 25/2.09 -> 23/1.97 -> 21/2.04 -> 27/2.32 -> 30/2.25 -C/w Sodium Bicarbonate 1300 mg po BID, Calcium Acetate po 667, and with Rena-Vit 1 tab po qHS -Repeat BMP in AM  4. HLD -C/w Rosovastatin 20 mg po Daily   5. GOUT -C/w Allopurinol 300 mg po Daily  6. Generalized Weakness and Deconditioning with Increased Back Pain causing difficulty in walking -Discussed with Neurosurgery Dr. Bevely Palmeritty and states no indication for inpatient evaluation and to follow up as an outpatient -Hx of Herniated Disks -PT to Evaluate and Treat -> Recommended Home Health PT  7. Acute Hypoxic Respiratory Failure suspect from Pulmonary HTN with Significant Emphysema -Room Air Saturations were 88% -Scheduled Albuterol Nebs q6h instead of prn -CXR in AM -Patient improved with O2 via [place] -Walk Screen qualifies patient for Home O2 -Likely from Pulmonary Hypertension as PA Pressure was  70. -Pulmonary Evaluation started and Pending -Appreciate Pulmonary Evaluation and Recc's -Right Heart Cath Done and showed Mild PAH with normal cardiac output and no evidence of RV strain  8. T7 Compression Fracture -Continue with Physical Therapy -Pain Control with Acetaminophen  DVT prophylaxis: SCD's Code Status: FULL Family Communication: Discussed Plan of Care with Wife at bedside Disposition Plan: Pending Cardiology Recc's  Consultants:   Vascular Surgery  Cardiology  Pulmonology  Procedures: ECHOcardiogram,  Myoview Stress Test. Left and Right Heart Cath  Antimicrobials: None  Subjective: Seen and examined at bedside after Myoview Stress. Had no complaints and denied Cp, N/V/Abdominal Pain or lightheadedness or dizziness. No other concerns or complaints.   Objective: Vitals:   12/27/15 1630 12/27/15 1645 12/27/15 1655 12/27/15 1734  BP: 139/75 138/67 135/71 (!) 142/78  Pulse: 67 67 75 70  Resp: 19 17 (!) 23 (!) 21  Temp:    98 F (36.7 C)  TempSrc:    Oral  SpO2: 96% 96% 97% 98%  Weight:      Height:       No intake or output data in the 24 hours ending 12/27/15 1911 Filed Weights   12/23/15 1448 12/23/15 2029 12/24/15 0600  Weight: 73.5 kg (  162 lb) 72.5 kg (159 lb 14.4 oz) 74.2 kg (163 lb 9.6 oz)    Examination: Physical Exam:  Constitutional: WN/WD, NAD and appears calm  Eyes: Lids and conjunctivae normal, sclerae anicteric  ENMT: External Ears, Nose appear normal. Grossly normal hearing. Neck: Appears normal, supple, no cervical masses, normal ROM, no appreciable thyromegaly.  Respiratory: Diminished with coarse breath sounds on auscultation bilaterally, no wheezing, rales, rhonchi or crackles. Normal respiratory effort and patient is not tachypenic. No accessory muscle use but wearing O2 via [place].  Cardiovascular: RRR, Slight 2/6 Sysotlic Murmur. No rubs / gallops. S1 and S2 auscultated. No extremity edema. Abdomen: Soft, non-tender, non-distended.  No masses palpated. No appreciable hepatosplenomegaly. Bowel sounds positive x4.  GU: Deferred. Musculoskeletal: No clubbing / cyanosis of digits/nails. No joint deformity upper and lower extremities. No contractures. Decreased Strength 4/5 in [place].  Skin: No rashes, lesions, ulcers. No induration; Warm and dry.  Neurologic: CN 2-12 grossly intact with no focal deficits. Sensation intact in all 4 Extremities. Romberg sign cerebellar reflexes not assessed.  Psychiatric: Normal judgment and insight. Alert and oriented x 3. Normal mood and appropriate affect.   Data Reviewed: I have personally reviewed following labs and imaging studies  CBC:  Recent Labs Lab 12/23/15 1445 12/24/15 0311 12/25/15 0423 12/26/15 0822 12/27/15 0324  WBC 10.6* 7.1 8.0 8.9 8.6  NEUTROABS 7.6  --  5.5 6.1 5.7  HGB 18.8* 15.3 16.3 18.8* 16.7  HCT 54.8* 45.6 48.4 55.3* 49.7  MCV 98.4 98.5 98.4 98.0 98.2  PLT 161 125* 118* 107* 123*   Basic Metabolic Panel:  Recent Labs Lab 12/23/15 1645 12/24/15 0311 12/25/15 0423 12/26/15 0822 12/27/15 0324  NA 138 140 140 138 139  K 4.2 4.5 4.6 4.7 4.6  CL 112* 114* 115* 109 111  CO2 20* 19* 18* 16* 21*  GLUCOSE 92 91 77 89 91  BUN 25* 23* 21* 27* 30*  CREATININE 2.09* 1.97* 2.04* 2.32* 2.25*  CALCIUM 9.6 8.8* 9.1 9.6 9.2  MG 2.2  --  2.0 2.2 2.2  PHOS  --   --  2.3* 2.5 2.9   GFR: Estimated Creatinine Clearance: 24 mL/min (by C-G formula based on SCr of 2.25 mg/dL (H)). Liver Function Tests:  Recent Labs Lab 12/23/15 1645 12/25/15 0423 12/26/15 0822 12/27/15 0324  AST 22 21 30 17   ALT 9* 7* 9* 7*  ALKPHOS 59 53 64 55  BILITOT 0.8 0.8 1.0 0.6  PROT 6.2* 5.3* 6.5 5.4*  ALBUMIN 3.7 3.3* 4.1 3.4*   No results for input(s): LIPASE, AMYLASE in the last 168 hours. No results for input(s): AMMONIA in the last 168 hours. Coagulation Profile:  Recent Labs Lab 12/23/15 1445  INR 1.03   Cardiac Enzymes: No results for input(s): CKTOTAL, CKMB,  CKMBINDEX, TROPONINI in the last 168 hours. BNP (last 3 results) No results for input(s): PROBNP in the last 8760 hours. HbA1C: No results for input(s): HGBA1C in the last 72 hours. CBG: No results for input(s): GLUCAP in the last 168 hours. Lipid Profile: No results for input(s): CHOL, HDL, LDLCALC, TRIG, CHOLHDL, LDLDIRECT in the last 72 hours. Thyroid Function Tests: No results for input(s): TSH, T4TOTAL, FREET4, T3FREE, THYROIDAB in the last 72 hours. Anemia Panel: No results for input(s): VITAMINB12, FOLATE, FERRITIN, TIBC, IRON, RETICCTPCT in the last 72 hours. Sepsis Labs: No results for input(s): PROCALCITON, LATICACIDVEN in the last 168 hours.  No results found for this or any previous visit (from the past 240 hour(s)).  Radiology Studies: Nm Myocar Multi W/spect W/wall Motion / Ef  Result Date: 12/27/2015  There was no ST segment deviation noted during stress.  No T wave inversion was noted during stress.  Defect 1: There is a defect present in the basal inferolateral, mid inferolateral and apical lateral location.  Findings consistent with ischemia.  This is an intermediate risk study.  The left ventricular ejection fraction is normal (55-65%).  There is a medium size, moderate severity reversible defect in the basal and mid inferolateral and apical lateral walls consistent with ischemia (SDS =4).   Scheduled Meds: . allopurinol  300 mg Oral Daily  . [START ON 12/28/2015] aspirin EC  81 mg Oral Daily  . calcium acetate  667 mg Oral BID WC  . [START ON 12/28/2015] enoxaparin (LOVENOX) injection  30 mg Subcutaneous Q24H  . loratadine  10 mg Oral QHS  . metoprolol succinate  50 mg Oral QHS  . multivitamin  1 tablet Oral QHS  . regadenoson      . rosuvastatin  20 mg Oral Daily  . sodium bicarbonate  1,300 mg Oral BID  . sodium chloride flush  3 mL Intravenous Q12H   Continuous Infusions: . sodium chloride 75 mL/hr at 12/27/15 1624    LOS: 2 days   Merlene Laughtermair Latif  Sheikh, DO [facility] Pager [phone]  If 7PM-7AM, please contact night-coverage [email or web address] Password [email or web address] 12/27/2015, 7:11 PM

## 2015-12-27 NOTE — Progress Notes (Signed)
Pt received from cath lab. Telemetry applied. CCMD notified. VSS. Call light within reach, will continue to monitor.   Leonidas Rombergaitlin S Bumbledare, RN

## 2015-12-27 NOTE — Consult Note (Signed)
Name: Kenneth PyoDavid G Howley Sr. MRN: [id number] DOB: 10/07/1934    ADMISSION DATE:  12/23/2015 CONSULTATION DATE:  12/26/15  REFERRING MD :  Dr. Marland McalpineSheikh   CHIEF COMPLAINT:  SOB    HISTORY OF PRESENT ILLNESS:  81 y/o M, former smoker (began age 81, quit age 81, 1+ ppd) with PMH of DJD, HTN, CAD, hypertensive renal disease / CKD 4 admitted 11/17 after incidental finding of thoracic aortic aneurysm.  The patient reports he had been having back pain and was sent for an MRI by his PCP. After he returned home, he was called by radiology and instructed to come to the ER for an incidental finding of a thoracic aortic aneurysm. Previously, the patient's back pain and been associated with his DJD and radicular symptoms in the legs. A CTA of the chest abdomen and pelvis was completed in the ER which showed a large infrarenal AAA.  The patient was evaluated and admitted by [facility] for further evaluation.  Dr. [name] of [facility] was consulted and felt AAA might be compatible with EVAR repair.  There also were concerns for a small B CIA aneurysm.  It was felt that there was no emergent repair needed at that time and the patient was scheduled for surgery on 12/4 to allow for further workup.  Cardiology was consulted for preoperative evaluation.  Echocardiogram was completed which revealed an LVEF of 55-60%, normal wall motion, grade 1 diastolic dysfunction, mildly dilated aortic root & PA peak pressure of 70 mmHg.    Given PA findings on echo, pulmonary medicine was consulted for pre-op evaluation.  Review of CTA images raises concern for emphysema.      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  Former smoker as above.  No recent travel.  He reports shortness of breath began approximately 2 months prior to admission with gradual onset. Prior to that, he denies any significant shortness of breath with exertion. He reports significant improvement with O2  application after admission.  Denies hx of sleep apnea.  No hx of autoimmune disorders.   SUBJECTIVE: no distress  VITAL SIGNS: Temp:  [97.2 F (36.2 C)-97.5 F (36.4 C)] 97.2 F (36.2 C) (11/21 0538) Pulse Rate:  [62-67] 62 (11/21 0538) Resp:  [18] 18 (11/21 0538) BP: (118-136)/(71-84) 136/83 (11/21 0940) SpO2:  [97 %] 97 % (11/21 0538)  PHYSICAL EXAMINATION: General:  Thin elderly male in NAD, supine in bed  Neuro:  AAOx4, speech clear, MAE, appropriate  HEENT:  MM pink/moist, jvd + Cardiovascular:  s1s2 rrr, no m/r/g Lungs:  Even/non-labored, lungs bilaterally with basilar inspiratory crackles R>L  Abdomen:  Soft, non-tender, bsx4 active  Musculoskeletal:  No acute deformities  Skin:  Warm/dry, no edema    Recent Labs Lab 12/25/15 0423 12/26/15 0822 12/27/15 0324  NA 140 138 139  K 4.6 4.7 4.6  CL 115* 109 111  CO2 18* 16* 21*  BUN 21* 27* 30*  CREATININE 2.04* 2.32* 2.25*  GLUCOSE 77 89 91     Recent Labs Lab 12/25/15 0423 12/26/15 0822 12/27/15 0324  HGB 16.3 18.8* 16.7  HCT 48.4 55.3* 49.7  WBC 8.0 8.9 8.6  PLT 118* 107* 123*    No results found.    SIGNIFICANT EVENTS  11/17  Admit after incidental finding of thoracic aneurysm on MRI for back pain 11/20  PCCM consulted for evaluation of dyspnea  STUDIES:  11/17  CTA Chest/abd/pelvis >> infrarenal abdominal aortic aneurysm  measuring 8 cm without evidence of leak, numerous hepatic and bilateral renal cysts, negative for PE, pulmonary emphysema 11/19  ECHO >> Normal LV systolic function; grade 1 diastolic dysfunction; mildly dilated aortic root; trace MR; mild TR with severely elevated pulmonary pressure.   ASSESSMENT / PLAN:  Discussion:  81 y/o M admitted with incidental finding of a large AAA.  Planned for surgery 12/4 per Dr. [name].  Work up revealed severely elevated pulmonary pressures on ECHO.  Hx of smoking, upholstery work.  CT of the chest raises concern for possible ILD but they are  expiratory cuts and motion noted.     Pulmonary Hypertension - suspect secondary to emphysema and occupational exposures. ? ILD on CT - consider HRCT of the chest to evaluate for pulmonary fibrosis given clinical exam and history - consider R heart cath to define [facility]  - assess for connective tissue disease for completeness given renal hx  Emphysema / COPD - not defined, no hx of PFT's   - change nebs to q4 PRN to allow for assessment of bronchodilator response on - - PFT's scheduled 12/27/15  Acute Hypoxic Respiratory Failure - secondary to above, RA sats on admit 88% Currently on 2 L [place] with saturations of 97% - O2 as needed to support sats 90-95% - will need ambulatory O2 needs assessment prior to discharge  - monitor intermittent CXR   CAD  - pending stress myoview per Cardiology Possible pulm venous HTN 2/2 Left Hrt dse. Completed 11/21>> results pending  OSA  - Will need sleep study as an outpatient to RO  Dr.Mannam spoke with patient and wife  and they are aware of the increased risk for pulmonary complications post op.  Patient and wife  updated at bedside this morning.   Bevelyn NgoSarah F. Groce, AGACNP-BC [facility] Pgr:  [phone] 12/27/2015, 9:46 AM  STAFF NOTE: Cindi CarbonI, Yuridia Couts, MD FACP have personally reviewed patient's available data, including medical history, events of note, physical examination and test results as part of my evaluation. I have discussed with resident/NP and other care providers such as pharmacist, RN and RRT. In addition, I personally evaluated patient and elicited key findings of: no distress, coarse BS, CT c./w  Significant emphysema, maintain copd regimen, avoid hypoxia (less 90%)in setting pa htn, agree we need rt heart cath, sounds like for today with left heart cath given ischemia concerns, would favor neg balance when able, about to receive contrast however, will correlate rt heart pressures to echo, looking for  connective tissue dz workj up in epic- pending  Mcarthur RossettiDaniel J. Tyson AliasFeinstein, MD, FACP Pgr: [phone]  Pulmonary & Critical Care 12/27/2015 1:48 PM

## 2015-12-27 NOTE — H&P (View-Only) (Signed)
Patient Name: Kenneth SCHUMM Sr. Date of Encounter: 12/27/2015  Primary Cardiologist: New (Dr. Delton See)  Hospital Problem List     Active Problems:   Chronic kidney disease (CKD), stage IV (severe) (HCC)   AAA (abdominal aortic aneurysm) (HCC)   Pulmonary hypertension   DOE (dyspnea on exertion)   Muscle weakness (generalized)     Subjective   Doing well. Dyspnea at baseline. Post stress test, hungry.  Inpatient Medications    Scheduled Meds: . allopurinol  300 mg Oral Daily  . aspirin EC  81 mg Oral Daily  . calcium acetate  667 mg Oral BID WC  . loratadine  10 mg Oral QHS  . metoprolol succinate  50 mg Oral QHS  . multivitamin  1 tablet Oral QHS  . regadenoson      . rosuvastatin  20 mg Oral Daily  . sodium bicarbonate  1,300 mg Oral BID   Continuous Infusions:  PRN Meds: acetaminophen **OR** acetaminophen, albuterol, fluticasone   Vital Signs    Vitals:   12/26/15 0749 12/26/15 2027 12/27/15 0538 12/27/15 0917  BP:  122/71 118/75 132/83  Pulse:  67 62   Resp:  18 18   Temp:  97.5 F (36.4 C) 97.2 F (36.2 C)   TempSrc:  Oral Oral   SpO2: 94% 97% 97%   Weight:      Height:        Intake/Output Summary (Last 24 hours) at 12/27/15 0941 Last data filed at 12/26/15 1856  Gross per 24 hour  Intake              360 ml  Output                0 ml  Net              360 ml   Filed Weights   12/23/15 1448 12/23/15 2029 12/24/15 0600  Weight: 162 lb (73.5 kg) 159 lb 14.4 oz (72.5 kg) 163 lb 9.6 oz (74.2 kg)    Physical Exam    GEN: Well nourished, well developed, in no acute distress.  HEENT: Grossly normal.  Neck: Supple, + JVD. No  carotid bruits, or masses. Cardiac: RRR, systolic cmurmur. No  rubs, or gallops. No clubbing, cyanosis, edema.  Radials/DP/PT 2+ and equal bilaterally.  Respiratory:  Respirations regular and unlabored, clear to auscultation bilaterally. GI: Soft, nontender, nondistended, BS + x 4. MS: no deformity or atrophy. Skin:  warm and dry, no rash. Neuro:  Strength and sensation are intact. Psych: AAOx3.  Normal affect.  Labs    CBC  Recent Labs  12/26/15 0822 12/27/15 0324  WBC 8.9 8.6  NEUTROABS 6.1 5.7  HGB 18.8* 16.7  HCT 55.3* 49.7  MCV 98.0 98.2  PLT 107* 123*   Basic Metabolic Panel  Recent Labs  12/26/15 0822 12/27/15 0324  NA 138 139  K 4.7 4.6  CL 109 111  CO2 16* 21*  GLUCOSE 89 91  BUN 27* 30*  CREATININE 2.32* 2.25*  CALCIUM 9.6 9.2  MG 2.2 2.2  PHOS 2.5 2.9   Liver Function Tests  Recent Labs  12/26/15 0822 12/27/15 0324  AST 30 17  ALT 9* 7*  ALKPHOS 64 55  BILITOT 1.0 0.6  PROT 6.5 5.4*  ALBUMIN 4.1 3.4*   No results for input(s): LIPASE, AMYLASE in the last 72 hours. Cardiac Enzymes No results for input(s): CKTOTAL, CKMB, CKMBINDEX, TROPONINI in the last 72 hours. BNP Invalid input(s):  POCBNP D-Dimer No results for input(s): DDIMER in the last 72 hours. Hemoglobin A1C No results for input(s): HGBA1C in the last 72 hours. Fasting Lipid Panel No results for input(s): CHOL, HDL, LDLCALC, TRIG, CHOLHDL, LDLDIRECT in the last 72 hours. Thyroid Function Tests No results for input(s): TSH, T4TOTAL, T3FREE, THYROIDAB in the last 72 hours.  Invalid input(s): FREET3  Telemetry    Unable to  Personally Review as patient see in muc med  ECG    N/A  Radiology    No results found.  Cardiac Studies   Angio Chest/abd/pel For Dissection W And/or W/wo  Result Date: 12/23/2015 CLINICAL DATA:  Abdominal aortic aneurysm with worsening back pain.  IMPRESSION: 1. Infrarenal fusiform abdominal aortic aneurysm measuring 8 cm transverse without evidence of acute intramural hematoma or aortic leak. Developing calcifications seen within the soft plaque along the left lateral aspect of this aneurysm. The aneurysm is 3.5 cm from the renal arteries it measures approximately 2.2 cm in caliber. The aneurysm terminates at the aortic bifurcation. There is ectasia of the  right common iliac artery to 1.9 cm in and more aneurysmal dilatation of the left common iliac artery to 2.9 cm with plaque noted. The external iliac arteries are patent measuring 0.6 cm on the right and 0.8 cm on the left. 2. Numerous hepatic and bilateral renal hypodensities consistent with cysts. Atrophic appearing kidneys bilaterally. 3. No pulmonary embolus or thoracic aortic aneurysm. No aortic dissection. All 4.  Pulmonary emphysema. 5. No acute thoracic abnormality. Chronic appearing T7 compression.   TELE: SR  TTE: 12/25/15 - Left ventricle: The cavity size was normal. Wall thickness was normal. Systolic function was normal. The estimated ejection fraction was in the range of 55% to 60%. Wall motion was normal; there were no regional wall motion abnormalities. Doppler parameters are consistent with abnormal left ventricular relaxation (grade 1 diastolic dysfunction). - Aortic root: The aortic root was mildly dilated. - Pulmonary arteries: Systolic pressure was severely increased. PA peak pressure: 70 mm Hg (S).  Impressions: - Normal LV systolic function; grade 1 diastolic dysfunction; mildly dilated aortic root; trace MR; mild TR with severely elevated pulmonary pressure.  Pending Myoview  Patient Profile     Kenneth Zavalais a 81 y.o.malewith medical history significant of chronic kidney disease stage IV, hypertension, hyperlipidemia here with large AAA who schedule for a surgery on 01/09/16.   Assessment & Plan    1. AAA - For surgery 01/09/16. Echo showed normal LVEF, grade 1 diastolic dysfunction, but severe pulonary hypertension. - CT of chest suggesting COPD (seen by pulmonary) and 3 V coronary calcification. Pending Myoview result.   2. HLD - Continue Crestor. Unknown LDL.   3. HTN - Stable on current medications.   Signed, Manson Passey, PA  12/27/2015, 9:41 AM   The patient was seen, examined and discussed with  [name] and I agree with the above.   81 year old male with a large AAA, schedule for a surgery on 01/09/16, with normal LVEF, grade 1 diastolic dysfunction, but severe pulonary hypertension. After reviewing his Chest CT - all sec to COPD, he is no treatment like home O2, Spiriva, I would recommend to call pulmonary to optimize him before surgery.  Chest CT also showed 3 vessel coronary calcifications, he reports SOB but has severe pulmonary hypertension.  The patient has an abnormal stress test consistent with ischemia in the basal and mid inferolateral and apical lateral walls.   We will schedule a left  and right cardiac cath for this afternoon with Dr Gala RomneyBensimhon.  [name] 12/27/2015

## 2015-12-27 NOTE — Progress Notes (Signed)
Physical Therapy Treatment Patient Details Name: Kenneth PyoDavid G Sanjose Sr. MRN: [id number] DOB: 11/17/1934 Today's Date: 12/27/2015    History of Present Illness 81 yo admitted with AAA after incidental finding on spinal MRI. PMhx: CKD, HTN, HLD, gout, back pain, left hip fx    PT Comments    Patient had a pleasant demeanor, and noted that he was tired from his stress test this morning. Patient required more assistance in moving from sit to stand today, and with walking he noted more fatigue in his legs because he has not been walking around much since physical therapy on 12/24/15. Patient requires increased oxygen with exertion and fatigues quickly, but was able to walk a further distance today. Will continue to follow.   SPO2 rest: 93, 2 L O2 SPO2 sitting: 93, 2 L O2  SPO2 beginning of walk: 93, 4 L O2 SPO2 mid-walk: 84, increased O2 to 6 L O2 SPO2 end: 90  HR rest: 70 HR sitting: 73  HR beginning of walk: 78 HR mid-walk: 83  HR end: 85    Follow Up Recommendations  Home health PT     Equipment Recommendations       Recommendations for Other Services       Precautions / Restrictions      Mobility  Bed Mobility Overal bed mobility: Needs Assistance Bed Mobility: Rolling;Sidelying to Sit Rolling: Min guard Sidelying to sit: Min assist       General bed mobility comments: cues required to roll from supine to sidelying. assistance needed for patient to lift trunk when moving from sidelying to sit.   Transfers Overall transfer level: Needs assistance Equipment used: Rolling walker (2 wheeled) Transfers: Sit to/from Stand Sit to Stand: Mod assist         General transfer comment: Patient required significant trunk support and cuing to push from bed while rising.   Ambulation/Gait Ambulation/Gait assistance: Min guard Ambulation Distance (Feet): 100 Feet Assistive device: Rolling walker (2 wheeled) Gait Pattern/deviations: Step-through pattern;Decreased stride  length;Trunk flexed Gait velocity: decreased   Gait velocity interpretation: Below normal speed for age/gender General Gait Details: cues required for stepping into RW, keeping upright posture, and taking deep breaths.    Stairs            Wheelchair Mobility    Modified Rankin (Stroke Patients Only)       Balance Overall balance assessment: Needs assistance Sitting-balance support: Feet supported Sitting balance-Leahy Scale: Fair     Standing balance support: Bilateral upper extremity supported Standing balance-Leahy Scale: Poor                      Cognition Arousal/Alertness: Awake/alert Behavior During Therapy: WFL for tasks assessed/performed Overall Cognitive Status: Within Functional Limits for tasks assessed                      Exercises      General Comments        Pertinent Vitals/Pain Pain Assessment: No/denies pain Pain Score: 0-No pain (no back pain at rest )    Home Living                      Prior Function            PT Goals (current goals can now be found in the care plan section) Acute Rehab PT Goals Patient Stated Goal: be able to walk without giving out PT Goal Formulation: With patient/family Time  For Goal Achievement: 01/10/16 Potential to Achieve Goals: Good Progress towards PT goals: Progressing toward goals    Frequency    Min 3X/week      PT Plan Current plan remains appropriate    Co-evaluation             End of Session Equipment Utilized During Treatment: Gait belt;Oxygen Activity Tolerance: Patient tolerated treatment well;Patient limited by fatigue Patient left: in bed;with call bell/phone within reach;with family/visitor present     Time: [id number] PT Time Calculation (min) (ACUTE ONLY): 34 min  Charges:  $Gait Training: 8-22 mins $Therapeutic Activity: 8-22 mins                    G Codes:      [name] 12/27/2015, 2:01 PM  Kenneth Karnes D. [name] [phone]

## 2015-12-27 NOTE — Progress Notes (Signed)
Patient Name: Kenneth SCHUMM Sr. Date of Encounter: 12/27/2015  Primary Cardiologist: New (Dr. Delton See)  Hospital Problem List     Active Problems:   Chronic kidney disease (CKD), stage IV (severe) (HCC)   AAA (abdominal aortic aneurysm) (HCC)   Pulmonary hypertension   DOE (dyspnea on exertion)   Muscle weakness (generalized)     Subjective   Doing well. Dyspnea at baseline. Post stress test, hungry.  Inpatient Medications    Scheduled Meds: . allopurinol  300 mg Oral Daily  . aspirin EC  81 mg Oral Daily  . calcium acetate  667 mg Oral BID WC  . loratadine  10 mg Oral QHS  . metoprolol succinate  50 mg Oral QHS  . multivitamin  1 tablet Oral QHS  . regadenoson      . rosuvastatin  20 mg Oral Daily  . sodium bicarbonate  1,300 mg Oral BID   Continuous Infusions:  PRN Meds: acetaminophen **OR** acetaminophen, albuterol, fluticasone   Vital Signs    Vitals:   12/26/15 0749 12/26/15 2027 12/27/15 0538 12/27/15 0917  BP:  122/71 118/75 132/83  Pulse:  67 62   Resp:  18 18   Temp:  97.5 F (36.4 C) 97.2 F (36.2 C)   TempSrc:  Oral Oral   SpO2: 94% 97% 97%   Weight:      Height:        Intake/Output Summary (Last 24 hours) at 12/27/15 0941 Last data filed at 12/26/15 1856  Gross per 24 hour  Intake              360 ml  Output                0 ml  Net              360 ml   Filed Weights   12/23/15 1448 12/23/15 2029 12/24/15 0600  Weight: 162 lb (73.5 kg) 159 lb 14.4 oz (72.5 kg) 163 lb 9.6 oz (74.2 kg)    Physical Exam    GEN: Well nourished, well developed, in no acute distress.  HEENT: Grossly normal.  Neck: Supple, + JVD. No  carotid bruits, or masses. Cardiac: RRR, systolic cmurmur. No  rubs, or gallops. No clubbing, cyanosis, edema.  Radials/DP/PT 2+ and equal bilaterally.  Respiratory:  Respirations regular and unlabored, clear to auscultation bilaterally. GI: Soft, nontender, nondistended, BS + x 4. MS: no deformity or atrophy. Skin:  warm and dry, no rash. Neuro:  Strength and sensation are intact. Psych: AAOx3.  Normal affect.  Labs    CBC  Recent Labs  12/26/15 0822 12/27/15 0324  WBC 8.9 8.6  NEUTROABS 6.1 5.7  HGB 18.8* 16.7  HCT 55.3* 49.7  MCV 98.0 98.2  PLT 107* 123*   Basic Metabolic Panel  Recent Labs  12/26/15 0822 12/27/15 0324  NA 138 139  K 4.7 4.6  CL 109 111  CO2 16* 21*  GLUCOSE 89 91  BUN 27* 30*  CREATININE 2.32* 2.25*  CALCIUM 9.6 9.2  MG 2.2 2.2  PHOS 2.5 2.9   Liver Function Tests  Recent Labs  12/26/15 0822 12/27/15 0324  AST 30 17  ALT 9* 7*  ALKPHOS 64 55  BILITOT 1.0 0.6  PROT 6.5 5.4*  ALBUMIN 4.1 3.4*   No results for input(s): LIPASE, AMYLASE in the last 72 hours. Cardiac Enzymes No results for input(s): CKTOTAL, CKMB, CKMBINDEX, TROPONINI in the last 72 hours. BNP Invalid input(s):  POCBNP D-Dimer No results for input(s): DDIMER in the last 72 hours. Hemoglobin A1C No results for input(s): HGBA1C in the last 72 hours. Fasting Lipid Panel No results for input(s): CHOL, HDL, LDLCALC, TRIG, CHOLHDL, LDLDIRECT in the last 72 hours. Thyroid Function Tests No results for input(s): TSH, T4TOTAL, T3FREE, THYROIDAB in the last 72 hours.  Invalid input(s): FREET3  Telemetry    Unable to  Personally Review as patient see in muc med  ECG    N/A  Radiology    No results found.  Cardiac Studies   Angio Chest/abd/pel For Dissection W And/or W/wo  Result Date: 12/23/2015 CLINICAL DATA:  Abdominal aortic aneurysm with worsening back pain.  IMPRESSION: 1. Infrarenal fusiform abdominal aortic aneurysm measuring 8 cm transverse without evidence of acute intramural hematoma or aortic leak. Developing calcifications seen within the soft plaque along the left lateral aspect of this aneurysm. The aneurysm is 3.5 cm from the renal arteries it measures approximately 2.2 cm in caliber. The aneurysm terminates at the aortic bifurcation. There is ectasia of the  right common iliac artery to 1.9 cm in and more aneurysmal dilatation of the left common iliac artery to 2.9 cm with plaque noted. The external iliac arteries are patent measuring 0.6 cm on the right and 0.8 cm on the left. 2. Numerous hepatic and bilateral renal hypodensities consistent with cysts. Atrophic appearing kidneys bilaterally. 3. No pulmonary embolus or thoracic aortic aneurysm. No aortic dissection. All 4.  Pulmonary emphysema. 5. No acute thoracic abnormality. Chronic appearing T7 compression.   TELE: SR  TTE: 12/25/15 - Left ventricle: The cavity size was normal. Wall thickness was normal. Systolic function was normal. The estimated ejection fraction was in the range of 55% to 60%. Wall motion was normal; there were no regional wall motion abnormalities. Doppler parameters are consistent with abnormal left ventricular relaxation (grade 1 diastolic dysfunction). - Aortic root: The aortic root was mildly dilated. - Pulmonary arteries: Systolic pressure was severely increased. PA peak pressure: 70 mm Hg (S).  Impressions: - Normal LV systolic function; grade 1 diastolic dysfunction; mildly dilated aortic root; trace MR; mild TR with severely elevated pulmonary pressure.  Pending Myoview  Patient Profile     Kenneth Zavalais a 81 y.o.malewith medical history significant of chronic kidney disease stage IV, hypertension, hyperlipidemia here with large AAA who schedule for a surgery on 01/09/16.   Assessment & Plan    1. AAA - For surgery 01/09/16. Echo showed normal LVEF, grade 1 diastolic dysfunction, but severe pulonary hypertension. - CT of chest suggesting COPD (seen by pulmonary) and 3 V coronary calcification. Pending Myoview result.   2. HLD - Continue Crestor. Unknown LDL.   3. HTN - Stable on current medications.   Signed, Manson Passey, PA  12/27/2015, 9:41 AM   The patient was seen, examined and discussed with  [name],Bhavinkumar PA-C and I agree with the above.   81 year old male with a large AAA, schedule for a surgery on 01/09/16, with normal LVEF, grade 1 diastolic dysfunction, but severe pulonary hypertension. After reviewing his Chest CT - all sec to COPD, he is no treatment like home O2, Spiriva, I would recommend to call pulmonary to optimize him before surgery.  Chest CT also showed 3 vessel coronary calcifications, he reports SOB but has severe pulmonary hypertension.  The patient has an abnormal stress test consistent with ischemia in the basal and mid inferolateral and apical lateral walls.   We will schedule a left  and right cardiac cath for this afternoon with Dr Gala RomneyBensimhon.  Tobias AlexanderKatarina Charene Mccallister 12/27/2015

## 2015-12-27 NOTE — Progress Notes (Signed)
Site area: rt groin Site Prior to Removal:  Level 0 Pressure Applied For:  10 minutes Manual:   yes Patient Status During Pull:  stable Post Pull Site:  Level 0 Post Pull Instructions Given:  yes Post Pull Pulses Present: yes Dressing Applied:  tegaderm Bedrest begins @ 1700 Comments:

## 2015-12-27 NOTE — Interval H&P Note (Signed)
History and Physical Interval Note:  12/27/2015 2:15 PM  Allene Pyoavid G Lisenbee Sr.  has presented today for surgery, with the diagnosis of PAH and abnormal stress test  The various methods of treatment have been discussed with the patient and family. After consideration of risks, benefits and other options for treatment, the patient has consented to  Procedure(s): Right/Left Heart Cath and Coronary Angiography (N/A) possible angioplasty as a surgical intervention .  The patient's history has been reviewed, patient examined, no change in status, stable for surgery.  I have reviewed the patient's chart and labs.  Questions were answered to the patient's satisfaction.   Cath Lab Visit (complete for each Cath Lab visit)  Clinical Evaluation Leading to the Procedure:   ACS: No.  Non-ACS:    Anginal Classification: CCS III  Anti-ischemic medical therapy: No Therapy  Non-Invasive Test Results: Intermediate-risk stress test findings: cardiac mortality 1-3%/year  Prior CABG: No previous CABG        Estefanny Moler, Reuel Boomaniel

## 2015-12-27 NOTE — Progress Notes (Signed)
Pt transported to cath lab. Filled out consent - pt still with questions so sent unsigned with the pt. Pt requested to speak with Dr. [name] prior to catheterization - Dr. [name] spoke on phone with pt. Telemetry removed, CCMD notified  Kenneth Rombergaitlin S Bumbledare, RN

## 2015-12-28 ENCOUNTER — Encounter (HOSPITAL_COMMUNITY): Payer: Self-pay | Admitting: Internal Medicine

## 2015-12-28 DIAGNOSIS — I25110 Atherosclerotic heart disease of native coronary artery with unstable angina pectoris: Secondary | ICD-10-CM

## 2015-12-28 LAB — POCT I-STAT 3, VENOUS BLOOD GAS (G3P V)
ACID-BASE DEFICIT: 3 mmol/L — AB (ref 0.0–2.0)
ACID-BASE DEFICIT: 6 mmol/L — AB (ref 0.0–2.0)
BICARBONATE: 19.2 mmol/L — AB (ref 20.0–28.0)
Bicarbonate: 21.9 mmol/L (ref 20.0–28.0)
O2 SAT: 69 %
O2 Saturation: 67 %
PCO2 VEN: 38 mmHg — AB (ref 44.0–60.0)
PH VEN: 7.345 (ref 7.250–7.430)
PO2 VEN: 36 mmHg (ref 32.0–45.0)
PO2 VEN: 37 mmHg (ref 32.0–45.0)
TCO2: 20 mmol/L (ref 0–100)
TCO2: 23 mmol/L (ref 0–100)
pCO2, Ven: 35.2 mmHg — ABNORMAL LOW (ref 44.0–60.0)
pH, Ven: 7.368 (ref 7.250–7.430)

## 2015-12-28 LAB — COMPREHENSIVE METABOLIC PANEL
ALBUMIN: 3.4 g/dL — AB (ref 3.5–5.0)
ALK PHOS: 58 U/L (ref 38–126)
ALT: 8 U/L — ABNORMAL LOW (ref 17–63)
ANION GAP: 8 (ref 5–15)
AST: 19 U/L (ref 15–41)
BILIRUBIN TOTAL: 0.8 mg/dL (ref 0.3–1.2)
BUN: 25 mg/dL — AB (ref 6–20)
CALCIUM: 9.4 mg/dL (ref 8.9–10.3)
CO2: 21 mmol/L — AB (ref 22–32)
Chloride: 111 mmol/L (ref 101–111)
Creatinine, Ser: 2.1 mg/dL — ABNORMAL HIGH (ref 0.61–1.24)
GFR calc Af Amer: 33 mL/min — ABNORMAL LOW (ref >60)
GFR calc non Af Amer: 28 mL/min — ABNORMAL LOW (ref >60)
GLUCOSE: 96 mg/dL (ref 65–99)
Potassium: 4.7 mmol/L (ref 3.5–5.1)
SODIUM: 140 mmol/L (ref 135–145)
TOTAL PROTEIN: 5.4 g/dL — AB (ref 6.5–8.1)

## 2015-12-28 LAB — CBC WITH DIFFERENTIAL/PLATELET
BASOS ABS: 0.1 10*3/uL (ref 0.0–0.1)
BASOS PCT: 1 %
EOS ABS: 0.6 10*3/uL (ref 0.0–0.7)
Eosinophils Relative: 7 %
HEMATOCRIT: 50.7 % (ref 39.0–52.0)
HEMOGLOBIN: 16.9 g/dL (ref 13.0–17.0)
Lymphocytes Relative: 18 %
Lymphs Abs: 1.5 10*3/uL (ref 0.7–4.0)
MCH: 32.8 pg (ref 26.0–34.0)
MCHC: 33.3 g/dL (ref 30.0–36.0)
MCV: 98.4 fL (ref 78.0–100.0)
MONOS PCT: 7 %
Monocytes Absolute: 0.6 10*3/uL (ref 0.1–1.0)
NEUTROS ABS: 5.6 10*3/uL (ref 1.7–7.7)
NEUTROS PCT: 67 %
Platelets: 131 10*3/uL — ABNORMAL LOW (ref 150–400)
RBC: 5.15 MIL/uL (ref 4.22–5.81)
RDW: 15.5 % (ref 11.5–15.5)
WBC: 8.3 10*3/uL (ref 4.0–10.5)

## 2015-12-28 LAB — MPO/PR-3 (ANCA) ANTIBODIES

## 2015-12-28 LAB — FANA STAINING PATTERNS
Homogeneous Pattern: 1:640 {titer}
Speckled Pattern: 1:80 {titer}

## 2015-12-28 LAB — PHOSPHORUS: Phosphorus: 2.8 mg/dL (ref 2.5–4.6)

## 2015-12-28 LAB — ANTINUCLEAR ANTIBODIES, IFA: ANTINUCLEAR ANTIBODIES, IFA: POSITIVE — AB

## 2015-12-28 LAB — C4 COMPLEMENT: Complement C4, Body Fluid: 22 mg/dL (ref 14–44)

## 2015-12-28 LAB — CYCLIC CITRUL PEPTIDE ANTIBODY, IGG/IGA: CCP Antibodies IgG/IgA: 3 units (ref 0–19)

## 2015-12-28 LAB — RHEUMATOID FACTOR: Rhuematoid fact SerPl-aCnc: <10 IU/mL (ref 0.0–13.9)

## 2015-12-28 LAB — MAGNESIUM: Magnesium: 2.2 mg/dL (ref 1.7–2.4)

## 2015-12-28 LAB — ANTI-SCLERODERMA ANTIBODY

## 2015-12-28 LAB — C3 COMPLEMENT: C3 Complement: 105 mg/dL (ref 82–167)

## 2015-12-28 MED ORDER — ALBUTEROL SULFATE (2.5 MG/3ML) 0.083% IN NEBU
2.5000 mg | INHALATION_SOLUTION | Freq: Four times a day (QID) | RESPIRATORY_TRACT | Status: DC
  Administered 2015-12-28 – 2015-12-29 (×2): 2.5 mg via RESPIRATORY_TRACT
  Filled 2015-12-28 (×3): qty 3

## 2015-12-28 MED ORDER — FLUTICASONE PROPIONATE 50 MCG/ACT NA SUSP
1.0000 | Freq: Every day | NASAL | Status: DC
  Administered 2015-12-28 – 2015-12-29 (×2): 1 via NASAL
  Filled 2015-12-28: qty 16

## 2015-12-28 MED ORDER — MOMETASONE FURO-FORMOTEROL FUM 100-5 MCG/ACT IN AERO
2.0000 | INHALATION_SPRAY | Freq: Two times a day (BID) | RESPIRATORY_TRACT | Status: DC
  Administered 2015-12-28 – 2015-12-29 (×2): 2 via RESPIRATORY_TRACT
  Filled 2015-12-28: qty 8.8

## 2015-12-28 MED ORDER — TRAMADOL HCL 50 MG PO TABS: 100.0000 mg | ORAL_TABLET | Freq: Two times a day (BID) | ORAL | Status: DC | PRN

## 2015-12-28 MED FILL — Nitroglycerin IV Soln 100 MCG/ML in D5W: INTRA_ARTERIAL | Qty: 10 | Status: AC

## 2015-12-28 NOTE — Progress Notes (Signed)
PT Cancellation Note  Patient Details Name: Kenneth PyoDavid G Dittus Sr. MRN: [id number] DOB: 10/14/1934   Cancelled Treatment:    Reason Eval/Treat Not Completed: Other (comment). Pt refused due to 10/10 low back pain radiating down his legs. Pt reports "im not walking because it's not good for me. It makes my back hurt and takes me all night to get better." Pt originally went to scan for lower back when AAA was found. Pt also refuses pain medicine. RN notified. Acute PT to return as able.   [name] 12/28/2015, 4:36 PM   Lewis ShockAshly Haylea Schlichting, PT, DPT Pager #: [phone] Office #: [phone]

## 2015-12-28 NOTE — Progress Notes (Signed)
Name: Kenneth PyoDavid G Benbrook Sr. MRN: [id number] DOB: 02/07/1934    ADMISSION DATE:  12/23/2015 CONSULTATION DATE:  12/26/15  REFERRING MD :  Dr. Marland McalpineSheikh   CHIEF COMPLAINT:  SOB    HISTORY OF PRESENT ILLNESS:  81 y/o M, former smoker (24 pack years) with PMH of DJD, HTN, CAD, hypertensive renal disease / CKD 4 admitted 11/17 after incidental finding of large AAA (8.1cm) as well as dyspnea.  Further w/u in anticipation of potential surgical repair also revealed PA peak pressure 70mmHg and concern for emphysema on CTA chest.  Given PA findings on echo, pulmonary medicine was consulted for pre-op evaluation.    Cardiac cath showed severe 3V CAD not amenable to PCI. CVTS was consulted and deemed not candidate for CABG   SUBJECTIVE:  No c/o.  Denies SOB at rest.   VITAL SIGNS: Temp:  [98 F (36.7 C)-98.4 F (36.9 C)] 98.4 F (36.9 C) (11/22 0615) Pulse Rate:  [0-79] 69 (11/22 0615) Resp:  [0-46] 18 (11/22 0615) BP: (109-175)/(50-102) 114/64 (11/22 0615) SpO2:  [0 %-98 %] 96 % (11/22 0615)  PHYSICAL EXAMINATION: General:  Thin elderly male in NAD, supine in bed  Neuro:  AAOx4, speech clear, MAE, appropriate  HEENT:  MM pink/moist, jvd + Cardiovascular:  s1s2 rrr, no m/r/g Lungs:  Even/non-labored, lungs bilaterally with basilar inspiratory crackles  Abdomen:  Soft, non-tender, bsx4 active  Musculoskeletal:  No acute deformities  Skin:  Warm/dry, no edema    Recent Labs Lab 12/26/15 0822 12/27/15 0324 12/28/15 0316  NA 138 139 140  K 4.7 4.6 4.7  CL 109 111 111  CO2 16* 21* 21*  BUN 27* 30* 25*  CREATININE 2.32* 2.25* 2.10*  GLUCOSE 89 91 96     Recent Labs Lab 12/26/15 0822 12/27/15 0324 12/28/15 0316  HGB 18.8* 16.7 16.9  HCT 55.3* 49.7 50.7  WBC 8.9 8.6 8.3  PLT 107* 123* 131*    Nm Myocar Multi W/spect W/wall Motion / Ef  Result Date: 12/27/2015  There was no ST segment deviation noted during stress.  No T wave inversion was noted during stress.  Defect 1:  There is a defect present in the basal inferolateral, mid inferolateral and apical lateral location.  Findings consistent with ischemia.  This is an intermediate risk study.  The left ventricular ejection fraction is normal (55-65%).  There is a medium size, moderate severity reversible defect in the basal and mid inferolateral and apical lateral walls consistent with ischemia (SDS =4).      SIGNIFICANT EVENTS  11/17  Admit after incidental finding of thoracic aneurysm on MRI for back pain 11/20  PCCM consulted for evaluation of dyspnea  STUDIES:  11/17  CTA Chest/abd/pelvis >> infrarenal abdominal aortic aneurysm measuring 8 cm without evidence of leak, numerous hepatic and bilateral renal cysts, negative for PE, pulmonary emphysema 11/19  ECHO >> Normal LV systolic function; grade 1 diastolic dysfunction; mildly dilated aortic root; trace MR; mild TR with severely elevated pulmonary pressure. 11/21 Cath>> 1. Severe 3v CAD not amenable to PCI 2. Mild PAH with normal cardiac output and no evidence of RV strain 3. Normal LV function by echo  ASSESSMENT / PLAN:  Discussion:  81 y/o M admitted with incidental finding of a large AAA without indication for emergent repair.  Planned for surgery 12/4 per Dr. [name].  Extensive pre-op work up revealed severely elevated pulmonary pressures on ECHO as well as severe 3V CAD deemed not candidate for CABG by CVTS.  Hx of smoking, upholstery work.  CT of the chest raises concern for possible ILD but they are expiratory cuts and motion noted.     Pulmonary Hypertension - suspect secondary to emphysema and occupational exposures. ? ILD on CT - consider HRCT of the chest to evaluate for pulmonary fibrosis given clinical exam and history - [facility]  - w/u for connective tissue dz pending especially given renal disease   Emphysema / COPD - not defined.  - cont BD's  - PFT's pending   Acute Hypoxic Respiratory Failure - secondary  to above, RA sats on admit 88% - O2 as needed to support sats 90-95% - likely needs home O2  - will need ambulatory O2 needs assessment prior to discharge  - monitor intermittent CXR   CAD - severe 3V disease - not candidate for CABG per CVTS   OSA - [facility]   Katy Whiteheart, NP 12/28/2015  11:28 AM Pager: [phone] or [phone]) [phone]   STAFF NOTE: I, Rory Percyaniel Coleton Woon, MD FACP have personally reviewed patient's available data, including medical history, events of note, physical examination and test results as part of my evaluation. I have discussed with resident/NP and other care providers such as pharmacist, RN and RRT. In addition, I personally evaluated patient and elicited key findings of: coarse BS, not SOB at rest, CVTS note reviewed, Pa pressures out of prooprtion likley to just lung dz, for PFT to define and agree consider High res CT as outpt, so far conn tissue work up unimpressive, neg balance as able, also fluid status may contribute to pa pressurs , will follow up friday   Mcarthur Rossettianiel J. Tyson AliasFeinstein, MD, FACP Pgr: [phone] [facility] 12/28/2015 12:34 PM

## 2015-12-28 NOTE — Progress Notes (Signed)
Patient refused to ambulate at this time. Said he was too weak and in pain. He refused pain medication. Currently on RA is 02 sats are 93%, on 2L 02 he was 94%. Will continue to monitor.  Minerva Endsiffany N Yousif Edelson

## 2015-12-28 NOTE — Care Management Note (Signed)
Case Management Note Donn PieriniKristi Abhiraj Dozal RN, BSN Unit 2W-Case Manager [phone]  Patient Details  Name: Kenneth PyoDavid G Boston Sr. MRN: [id number] Date of Birth: 12/30/1934  Subjective/Objective:  Pt admitted with AAA, also found 3VD, not a surgical candidate for CABG                  Action/Plan: PTA pt lived at home- noted order for home 02- however pt currently 93% on RA- has refused to ambulate- CM will follow for home 02 needs- pt at this time does not qualify for home 02  Expected Discharge Date:              Expected Discharge Plan:  Home/Self Care  In-House Referral:     Discharge planning Services  CM Consult  Post Acute Care Choice:  Durable Medical Equipment Choice offered to:     DME Arranged:  Oxygen DME Agency:     HH Arranged:    HH Agency:     Status of Service:  In process, will continue to follow  If discussed at Long Length of Stay Meetings, dates discussed:    Additional Comments:  Darrold SpanWebster, Matea Stanard Hall, RN 12/28/2015, 1:56 PM

## 2015-12-28 NOTE — Progress Notes (Signed)
1 Day Post-Op Procedure(s) (LRB): Right/Left Heart Cath and Coronary Angiography (N/A) Subjective: Patient examined, coronary angiogram and echocardiogram and CT scan of chest all personally reviewed and counseled with patient  81 year old Caucasian male reformed smoker admitted with low back pain and an 8 cm infrarenal AAA. Stent graft repair is planned. The patient had a stress test-perfusion scan which was intermediate for ischemia. The patient had no ST segment changes and was asymptomatic. Echocardiogram shows LVH with preserved LV systolic function and without significant valvular disease. Coronary arteriograms demonstrate three-vessel CAD, 80-90% stenosis of the 3 main coronary arteries. Patient has pulmonary hypertension PA pressures 60/28. Patient has dyspnea with exertion. CT scan of chest shows interstitial pulmonary fibrosis. Bedside  Mechanics are extremely poor. The patient also has chronic renal failure and has had a right arm AV fistula placed for dialysis. Creatinine is 2.5.  Asked to see patient for possible CABG. The patient's coronary anatomy and LV function is adequate for surgery however the patient has stable three-vessel CAD and no strong indication for CABG at this time. The patients risk factors for CABG however are extremely high related to end-stage lung and renal disease and would preclude any benefit from surgical revascularization. I would recommend proceeding with EVAR therapy of the AAA and medical management of CAD. He also feels much better on nasal oxygen in the hospital and that should be continued and bronchodilator therapy rescheduled for 4 times a day dosing. Objective: Vital signs in last 24 hours: Temp:  [98 F (36.7 C)-98.4 F (36.9 C)] 98.4 F (36.9 C) (11/22 0615) Pulse Rate:  [0-79] 69 (11/22 0615) Cardiac Rhythm: Normal sinus rhythm (11/22 0713) Resp:  [0-46] 18 (11/22 0615) BP: (109-175)/(50-102) 114/64 (11/22 0615) SpO2:  [0 %-98 %] 96 % (11/22  0615)  Hemodynamic parameters for last 24 hours:    Intake/Output from previous day: No intake/output data recorded. Intake/Output this shift: Total I/O In: 240 [P.O.:240] Out: -       Physical Exam  General: Elderly frail Caucasian male on nasal cannula alert and responsive HEENT: Normocephalic pupils equal , dentition adequate-full plates Neck: Supple with 2+ JVD,  No cervical adenopathy, or bruit Chest: Breath sounds course bilaterally, no rhonchi, no tenderness             or deformity Cardiovascular: Regular rate and rhythm, no murmur, no gallop, peripheral pulses             palpable in all extremities Abdomen:  Soft, nontender, no palpable mass or organomegaly Extremities: Warm, well-perfused, no clubbing cyanosis edema or tenderness, right arm AV fistula for dialysis access              no venous stasis changes of the legs Rectal/GU: Deferred Neuro: Grossly non--focal and symmetrical throughout Skin: Clean and dry without rash or ulceration   Lab Results:  Recent Labs  12/27/15 0324 12/28/15 0316  WBC 8.6 8.3  HGB 16.7 16.9  HCT 49.7 50.7  PLT 123* 131*   BMET:  Recent Labs  12/27/15 0324 12/28/15 0316  NA 139 140  K 4.6 4.7  CL 111 111  CO2 21* 21*  GLUCOSE 91 96  BUN 30* 25*  CREATININE 2.25* 2.10*  CALCIUM 9.2 9.4    PT/INR: No results for input(s): LABPROT, INR in the last 72 hours. ABG    Component Value Date/Time   PHART 7.379 12/27/2015 1511   HCO3 19.0 (L) 12/27/2015 1511   TCO2 20 12/27/2015 1511   ACIDBASEDEF 5.0 (  H) 12/27/2015 1511   O2SAT 91.0 12/27/2015 1511   CBG (last 3)  No results for input(s): GLUCAP in the last 72 hours.  Assessment/Plan: S/P Procedure(s) (LRB): Right/Left Heart Cath and Coronary Angiography (N/A) Patient would not benefit from CABG and is not a candidate for surgical coronary revascularization for the above reasons.   LOS: 3 days    Kathlee Nationseter Van Trigt III 12/28/2015

## 2015-12-28 NOTE — Progress Notes (Signed)
PROGRESS NOTE    Kenneth PyoDavid G Pettway Sr.  WJX:[id number] DOB: 04/24/1934 DOA: 12/23/2015 PCP: Guadalupe MapleGAGE, [name], MD  Brief Narrative: Kenneth Pyoavid G Power Sr. is a 81 y.o. male with medical history significant of chronic kidney disease stage IV, hypertension, hyperlipidemia. Patient was having back pain and his PCP sent him for an MRI of his back. Patient had returned home after the MRI when he was called by radiology and told to come to the ER for an incidental finding of a thoracic aortic aneurysm. Patient's back pain previously had been associated with degenerative disc disease with radicular symptoms in his legs. Patient had a CTA in the ER of the chest abdomen and pelvis. He was found to have a large infrarenal AAA that may be compatible for EVAR repair. He was seen by Dr. [name] in the ER suggested that patient be watched in the hospital after receiving IV contrast with chronic kidney disease stage IV. Per patient his normal GFR ranges between 15-20. He follows with [name] on a regular basis.  Per vascular they would like a Cardiology consult to get a preop evaluation. Tentatively surgery is planned for December 4. Cardiology came to evaluate the patient and are concerned that his dyspnea may have a cardiac component as opposed to just his chronic Emphysemetatous Disease and Right Heart Failure may be may be related to Pulmonary HTN from his pulmonary Disease or 2/2 to Left-Sided Disease so is undergoing an Echocardiogram and possible Myoview Stress Test. ECHO was done and PT evaluated the patient and patient required O2 via [place] for Ambulation. Cardiology will perform a MyoView Stress test iand Pulmonary was consulted for Pre-Op Evaluation as well. After MyoView patient underwent Left and Right Heart Catheterization.  Assessment & Plan:   Active Problems:   Stage 4 chronic kidney disease (HCC)   Abdominal aortic aneurysm (AAA) without rupture (HCC)   Pulmonary hypertension   DOE (dyspnea on exertion)  Muscle weakness (generalized)   Acute right-sided low back pain with sciatica   3-vessel CAD  1. AAA -Seen by Vascular- Dr. [name] -Per Dr. Nicky Pughhen's note There is no evidence of rupture on the CTA despite the MRI findings.  +There is NO indicate for emergent repair of this large AAA. -discussed with Dr Imogene Burnchen PA , ok to discharge. They will arrange sx for next week.   2-Acute Hypoxic Respiratory Failure suspect from Pulmonary HTN with Significant Emphysema -Scheduled Albuterol Nebs q6h instead of prn -Likely from Pulmonary Hypertension as PA Pressure was 70. -Right Heart Cath Done and showed Mild PAH with normal cardiac output and no evidence of RV strain -high risk for procedure per pulmonologist/  Needs outpatient CT and sleep study.   3-Dyspnea;  -Myoview Stress test abnormal.  -Because of Abnormal Stress Test Patient underwent Left and Right Heart Catheterization Cath showed ;. Severe 3v CAD not amenable to PCI Needs home oxygen,   4. Severe 3 Vessel Coronary Artery Disease not Amenable to CABG -As Above -C/w ASA 81 mg and Rousovastatin 20 mg po qHS -no candidate for surgery per Dr Zenaida NieceVan Tright   5. CKD-stage IV -follows with [name] -Has fistula that is ready for possible dialysis -IVF as patient got a contrast load with Heart Cath; C/w NS 75 mL/hr per Protocol -Patients BUN/Cr went from 25/2.09 -> 23/1.97 -> 21/2.04 -> 27/2.32 -> 30/2.25 -C/w Sodium Bicarbonate 1300 mg po BID, Calcium Acetate po 667, and with Rena-Vit 1 tab po qHS Stable.   5. HLD -C/w Rosovastatin  20 mg po Daily   6. GOUT -C/w Allopurinol 300 mg po Daily  7. Generalized Weakness and Deconditioning with Increased Back Pain causing difficulty in walking -Discussed with Neurosurgery Dr. Bevely Palmeritty and states no indication for inpatient evaluation and to follow up as an outpatient -Hx of Herniated Disks -PT to Evaluate and Treat -> Recommended Home Health PT   8. T7 Compression Fracture -Continue with  Physical Therapy -Pain Control with Acetaminophen  DVT prophylaxis: SCD's Code Status: FULL Family Communication: Discussed Plan of Care with Wife at bedside Disposition Plan: Pending Cardiology Recc's, home 11-23 if clear by cardio.   Consultants:   Vascular Surgery  Cardiology  Pulmonology  Procedures: ECHOcardiogram,  Myoview Stress Test. Left and Right Heart Cath  Antimicrobials: None  Subjective: He is feeling well, denies worsening dyspnea.   Objective: Vitals:   12/28/15 0615 12/28/15 1308 12/28/15 1331 12/28/15 1332  BP: 114/64 (!) 104/58    Pulse: 69 78    Resp: 18 18    Temp: 98.4 F (36.9 C) 99 F (37.2 C)    TempSrc: Oral Oral    SpO2: 96% 94% 94% 93%  Weight:      Height:        Intake/Output Summary (Last 24 hours) at 12/28/15 1440 Last data filed at 12/28/15 1230  Gross per 24 hour  Intake              600 ml  Output                0 ml  Net              600 ml   Filed Weights   12/23/15 1448 12/23/15 2029 12/24/15 0600  Weight: 73.5 kg (162 lb) 72.5 kg (159 lb 14.4 oz) 74.2 kg (163 lb 9.6 oz)    Examination: Physical Exam:  Constitutional: WN/WD, NAD and appears calm  Eyes: Lids and conjunctivae normal, sclerae anicteric  ENMT: External Ears, Nose appear normal. Grossly normal hearing. Neck: Appears normal, supple, no cervical masses, normal ROM, no appreciable thyromegaly.  Respiratory: Diminished with coarse breath sounds on auscultation bilaterally, no wheezing, rales, rhonchi or crackles. Normal respiratory effort and patient is not tachypenic. No accessory muscle use but wearing O2 via .  Cardiovascular: RRR, Slight 2/6 Sysotlic Murmur. No rubs / gallops. S1 and S2 auscultated. No extremity edema. Abdomen: Soft, non-tender, non-distended. No masses palpated. No appreciable hepatosplenomegaly. Bowel sounds positive x4.  GU: Deferred. Musculoskeletal: No clubbing / cyanosis of digits/nails. No joint deformity upper and lower  extremities. No contractures. Decreased Strength 4/5 in Le.  Skin: No rashes, lesions, ulcers. No induration; Warm and dry.  Neurologic: CN 2-12 grossly intact with no focal deficits. Sensation intact in all 4 Extremities. Romberg sign cerebellar reflexes not assessed.  Psychiatric: Normal judgment and insight. Alert and oriented x 3. Normal mood and appropriate affect.   Data Reviewed: I have personally reviewed following labs and imaging studies  CBC:  Recent Labs Lab 12/23/15 1445 12/24/15 0311 12/25/15 0423 12/26/15 0822 12/27/15 0324 12/28/15 0316  WBC 10.6* 7.1 8.0 8.9 8.6 8.3  NEUTROABS 7.6  --  5.5 6.1 5.7 5.6  HGB 18.8* 15.3 16.3 18.8* 16.7 16.9  HCT 54.8* 45.6 48.4 55.3* 49.7 50.7  MCV 98.4 98.5 98.4 98.0 98.2 98.4  PLT 161 125* 118* 107* 123* 131*   Basic Metabolic Panel:  Recent Labs Lab 12/23/15 1645 12/24/15 0311 12/25/15 0423 12/26/15 0822 12/27/15 0324 12/28/15 0316  NA 138  140 140 138 139 140  K 4.2 4.5 4.6 4.7 4.6 4.7  CL 112* 114* 115* 109 111 111  CO2 20* 19* 18* 16* 21* 21*  GLUCOSE 92 91 77 89 91 96  BUN 25* 23* 21* 27* 30* 25*  CREATININE 2.09* 1.97* 2.04* 2.32* 2.25* 2.10*  CALCIUM 9.6 8.8* 9.1 9.6 9.2 9.4  MG 2.2  --  2.0 2.2 2.2 2.2  PHOS  --   --  2.3* 2.5 2.9 2.8   GFR: Estimated Creatinine Clearance: 25.7 mL/min (by C-G formula based on SCr of 2.1 mg/dL (H)). Liver Function Tests:  Recent Labs Lab 12/23/15 1645 12/25/15 0423 12/26/15 0822 12/27/15 0324 12/28/15 0316  AST 22 21 30 17 19   ALT 9* 7* 9* 7* 8*  ALKPHOS 59 53 64 55 58  BILITOT 0.8 0.8 1.0 0.6 0.8  PROT 6.2* 5.3* 6.5 5.4* 5.4*  ALBUMIN 3.7 3.3* 4.1 3.4* 3.4*   No results for input(s): LIPASE, AMYLASE in the last 168 hours. No results for input(s): AMMONIA in the last 168 hours. Coagulation Profile:  Recent Labs Lab 12/23/15 1445  INR 1.03   Cardiac Enzymes: No results for input(s): CKTOTAL, CKMB, CKMBINDEX, TROPONINI in the last 168 hours. BNP (last 3  results) No results for input(s): PROBNP in the last 8760 hours. HbA1C: No results for input(s): HGBA1C in the last 72 hours. CBG: No results for input(s): GLUCAP in the last 168 hours. Lipid Profile: No results for input(s): CHOL, HDL, LDLCALC, TRIG, CHOLHDL, LDLDIRECT in the last 72 hours. Thyroid Function Tests: No results for input(s): TSH, T4TOTAL, FREET4, T3FREE, THYROIDAB in the last 72 hours. Anemia Panel: No results for input(s): VITAMINB12, FOLATE, FERRITIN, TIBC, IRON, RETICCTPCT in the last 72 hours. Sepsis Labs: No results for input(s): PROCALCITON, LATICACIDVEN in the last 168 hours.  No results found for this or any previous visit (from the past 240 hour(s)).   Radiology Studies: Nm Myocar Multi W/spect W/wall Motion / Ef  Result Date: 12/27/2015  There was no ST segment deviation noted during stress.  No T wave inversion was noted during stress.  Defect 1: There is a defect present in the basal inferolateral, mid inferolateral and apical lateral location.  Findings consistent with ischemia.  This is an intermediate risk study.  The left ventricular ejection fraction is normal (55-65%).  There is a medium size, moderate severity reversible defect in the basal and mid inferolateral and apical lateral walls consistent with ischemia (SDS =4).   Scheduled Meds: . albuterol  2.5 mg Nebulization QID  . allopurinol  300 mg Oral Daily  . aspirin EC  81 mg Oral Daily  . calcium acetate  667 mg Oral BID WC  . enoxaparin (LOVENOX) injection  30 mg Subcutaneous Q24H  . fluticasone  1 spray Each Nare Daily  . loratadine  10 mg Oral QHS  . metoprolol succinate  50 mg Oral QHS  . mometasone-formoterol  2 puff Inhalation BID  . multivitamin  1 tablet Oral QHS  . rosuvastatin  20 mg Oral Daily  . sodium bicarbonate  1,300 mg Oral BID  . sodium chloride flush  3 mL Intravenous Q12H   Continuous Infusions:   LOS: 3 days   Hartley Barefoot A,MD [facility] Pager  [phone]  If 7PM-7AM, please contact night-coverage [email or web address] Password [email or web address] 12/28/2015, 2:40 PM

## 2015-12-29 MED ORDER — ALBUTEROL SULFATE (2.5 MG/3ML) 0.083% IN NEBU: 2.5000 mg | INHALATION_SOLUTION | RESPIRATORY_TRACT | Status: DC | PRN

## 2015-12-29 MED ORDER — MOMETASONE FURO-FORMOTEROL FUM 100-5 MCG/ACT IN AERO: 2.0000 | INHALATION_SPRAY | Freq: Two times a day (BID) | RESPIRATORY_TRACT | 0 refills | Status: DC

## 2015-12-29 MED ORDER — ACETAMINOPHEN 325 MG PO TABS: 650.0000 mg | ORAL_TABLET | ORAL | 0 refills | Status: AC | PRN

## 2015-12-29 MED ORDER — LEVALBUTEROL HCL 0.63 MG/3ML IN NEBU: 0.6300 mg | INHALATION_SOLUTION | Freq: Three times a day (TID) | RESPIRATORY_TRACT | Status: DC

## 2015-12-29 MED ORDER — NITROGLYCERIN 0.4 MG SL SUBL: 0.4000 mg | SUBLINGUAL_TABLET | SUBLINGUAL | 0 refills | Status: AC | PRN

## 2015-12-29 NOTE — Progress Notes (Signed)
SATURATION QUALIFICATIONS: (This note is used to comply with regulatory documentation for home oxygen)  Patient Saturations on Room Air at Rest = 90%  Patient Saturations on Room Air while Ambulating = 87%  Patient Saturations on 2 Liters of oxygen while Ambulating = 94%  Please briefly explain why patient needs home oxygen:  Patient even feels short of breath at times of rest, during walk within a few steps patient became very short of breath and required oxygen to finish walk.   Minerva Endsiffany N Lundon Rosier  RN

## 2015-12-29 NOTE — Care Management Note (Signed)
Case Management Note  Patient Details  Name: Kenneth PyoDavid G Leverich Sr. MRN: [id number] Date of Birth: 05/31/1934  Subjective/Objective:     CKD, AAA Pulm HTN               Action/Plan: Discharge Planning: AVS reviewed: NCM reviewed chart. Spoke to pt and lives at home with wife who can also assist as needed. Has RW and bedside commode. Will need oxygen. [facility] concentrator to [facility]. Contacted [facility] DME rep with new referral.    Expected Discharge Date:  12/24/15               Expected Discharge Plan:  Home/Self Care  In-House Referral:  NA  Discharge planning Services  CM Consult  Post Acute Care Choice:  Durable Medical Equipment Choice offered to:  NA  DME Arranged:  Oxygen DME Agency:  [facility]  HH Arranged:  NA HH Agency:  NA  Status of Service:  Completed, signed off  If discussed at Long Length of Stay Meetings, dates discussed:    Additional Comments:  Elliot CousinShavis, Patriciaann Rabanal Ellen, RN 12/29/2015, 11:01 AM

## 2015-12-29 NOTE — Discharge Summary (Signed)
Physician Discharge Summary  Kenneth AKE Sr. WJX:[id number] DOB: 06/12/1934 DOA: 12/23/2015  PCP: Kenneth Maple, MD  Admit date: 12/23/2015 Discharge date: 12/29/2015  Admitted From: Home  Disposition: Home   Recommendations for Outpatient Follow-up:  1. Follow up with PCP in 1-2 weeks 2. Please obtain BMP/CBC in one week   Equipment/Devices: home oxygen   Discharge Condition: Stable.  CODE STATUS:  Diet recommendation: Heart Healthy   Brief/Interim Summary: [name] Zavalais a 81 y.o.malewith medical history significant of chronic kidney disease stage IV, hypertension, hyperlipidemia. Patient was having back pain and his PCP sent him for an MRI of his back. Patient had returned home after the MRI when he was called by radiology and told to come to the ER for an incidental finding of a thoracic aortic aneurysm. Patient's back pain previously had been associated with degenerative disc disease with radicular symptoms in his legs. Patient had a CTA in the ER of the chest abdomen and pelvis. He was found to have a large infrarenal AAA that may be compatible for EVAR repair. He was seen by Dr. Imogene [name] in the ER suggested that patient be watched in the hospital after receiving IV contrast with chronic kidney disease stage IV. Per patient his normal GFR ranges between 15-20. He follows with Dr. Eda [name] a regular basis. Per vascular they would like a Cardiology consult to get a preop evaluation. Tentatively surgery is planned for December 4. Cardiology came to evaluate the patient and are concerned that his dyspnea may have a cardiac component as opposed to just his chronic Emphysemetatous Disease and Right Heart Failure may be may be related to Pulmonary HTN from his pulmonary Disease or 2/2 to Left-Sided Disease so is undergoing an Echocardiogram and possible Myoview Stress Test. ECHO was done and PT evaluated the patient and patient required O2 via [place] for Ambulation. Cardiology will perform  a MyoView Stress test iand Pulmonary was consulted for Pre-Op Evaluation as well. After MyoView patient underwent Left and Right Heart Catheterization.  Assessment & Plan: 1. AAA -Seen by Vascular- Dr. Imogene [name] -Per Dr. Nicky [name] note There is no evidence of rupture on the CTA despite the MRI findings.  +There is NO indicate for emergent repair of this large AAA. -discussed with Dr Kenneth Burn PA , ok to discharge. They will arrange sx for next week.    2-Acute Hypoxic Respiratory Failure suspect from Pulmonary HTN with Significant Emphysema -Scheduled Albuterol Nebs q6h instead of prn -Likely from Pulmonary Hypertension as PA Pressure was 70. -Right Heart Cath Done and showed Mild PAH with normal cardiac output and no evidence of RV strain -high risk for procedure per pulmonologist/  Needs outpatient CT and sleep study.  needs to follow up with Pulmonary prior to sx.   3-Dyspnea;  -Myoview Stress test abnormal.  -Because of Abnormal Stress Test Patient underwent Left and Right Heart Catheterization Cath showed ;. Severe 3v CAD not amenable to PCI Needs home oxygen, arranged.   4. Severe 3 Vessel Coronary Artery Disease not Amenable to CABG -As Above -C/w ASA 81 mg and Rousovastatin 20 mg po qHS -no candidate for surgery per Dr [name]   5. CKD-stage IV -follows with Dr. Darrick [name] -Has fistula that is ready for possible dialysis -IVF as patient got a contrast load with Heart Cath; C/w NS 75 mL/hr per Protocol -Patients BUN/Cr went from 25/2.09 -> 23/1.97 -> 21/2.04 -> 27/2.32 -> 30/2.25 -C/w Sodium Bicarbonate 1300 mg po BID, Calcium Acetate po 667, and  with Rena-Vit 1 tab po qHS Stable.   5. HLD -C/w Rosovastatin 20 mg po Daily   6. GOUT -C/w Allopurinol 300 mg po Daily  7. Generalized Weakness and Deconditioning with Increased Back Pain causing difficulty in walking -Discussed with Neurosurgery Dr. Bevely [name] and states no indication for inpatient evaluation and to follow up as  an outpatient -Hx of Herniated Disks -PT to Evaluate and Treat -> Recommended Home Health PT   8. T7 Compression Fracture -Continue with Physical Therapy -Pain Control with Acetaminophen  Discharge Diagnoses:  Active Problems:   Stage 4 chronic kidney disease (HCC)   Abdominal aortic aneurysm (AAA) without rupture (HCC)   Pulmonary hypertension   DOE (dyspnea on exertion)   Muscle weakness (generalized)   Acute right-sided low back pain with sciatica   3-vessel CAD    Discharge Instructions  Discharge Instructions    Diet - low sodium heart healthy    Complete by:  As directed    Increase activity slowly    Complete by:  As directed        Medication List    TAKE these medications   acetaminophen 325 MG tablet Commonly known as:  TYLENOL Take 2 tablets (650 mg total) by mouth every 4 (four) hours as needed for headache or mild pain.   allopurinol 300 MG tablet Commonly known as:  ZYLOPRIM Take 300 mg by mouth daily.   aspirin EC 81 MG tablet Take 81 mg by mouth daily.   calcium acetate 667 MG capsule Commonly known as:  PHOSLO Take 667 mg by mouth 2 (two) times daily with a meal.   fluticasone 50 MCG/ACT nasal spray Commonly known as:  FLONASE Place 1 spray into both nostrils daily as needed.   loratadine 10 MG tablet Commonly known as:  CLARITIN Take 10 mg by mouth at bedtime.   metoprolol succinate 50 MG 24 hr tablet Commonly known as:  TOPROL-XL Take 50 mg by mouth at bedtime. Take with or immediately following a meal.   mometasone-formoterol 100-5 MCG/ACT Aero Commonly known as:  DULERA Inhale 2 puffs into the lungs 2 (two) times daily.   nitroGLYCERIN 0.4 MG SL tablet Commonly known as:  NITROSTAT Place 1 tablet (0.4 mg total) under the tongue every 5 (five) minutes as needed for chest pain.   oxyCODONE-acetaminophen 5-325 MG tablet Commonly known as:  ROXICET Take 1-2 tablets by mouth every 6 (six) hours as needed for moderate pain.    RENA-VITE RX 1 MG Tabs Take 1 mg by mouth daily.   rosuvastatin 20 MG tablet Commonly known as:  CRESTOR Take 20 mg by mouth daily.   sodium bicarbonate 650 MG tablet Take 1,300 mg by mouth 2 (two) times daily.   VENTOLIN HFA 108 (90 Base) MCG/ACT inhaler Generic drug:  albuterol Inhale 1 puff into the lungs every 4 (four) hours as needed for wheezing.            Durable Medical Equipment        Start     Ordered   12/29/15 1011  For home use only DME oxygen  Once    Question Answer Comment  Mode or (Route) Nasal cannula   Liters per Minute 2   Frequency Continuous (stationary and portable oxygen unit needed)   Oxygen delivery system Gas      12/29/15 1010   12/28/15 1217  For home use only DME oxygen  Once    Question Answer Comment  Mode or (Route) Nasal  cannula   Liters per Minute 2   Frequency Continuous (stationary and portable oxygen unit needed)   Oxygen delivery system Gas      12/28/15 1217     Follow-up Information    [facility] Dorcas McmurrayA De Dios, MD Follow up in 1 week(s).   Specialty:  Pulmonary Disease Why:  please call office to set appointment. you need PFT  Contact information: [address] [place] [address] [phone]          Allergies  Allergen Reactions  . Phenobarbital Hives and Itching  . Prednisone Rash    Consultations:  CVTS  Vascular   Pulmonary    Procedures/Studies: Nm Myocar Multi W/spect W/wall Motion / Ef  Result Date: 12/27/2015  There was no ST segment deviation noted during stress.  No T wave inversion was noted during stress.  Defect 1: There is a defect present in the basal inferolateral, mid inferolateral and apical lateral location.  Findings consistent with ischemia.  This is an intermediate risk study.  The left ventricular ejection fraction is normal (55-65%).  There is a medium size, moderate severity reversible defect in the basal and mid inferolateral and apical lateral walls  consistent with ischemia (SDS =4).   Ct Angio Chest/abd/pel For Dissection W And/or W/wo  Result Date: 12/23/2015 CLINICAL DATA:  Abdominal aortic aneurysm with worsening back pain. EXAM: CT ANGIOGRAPHY CHEST, ABDOMEN AND PELVIS TECHNIQUE: Multidetector CT imaging through the chest, abdomen and pelvis was performed using the standard protocol during bolus administration of intravenous contrast. Multiplanar reconstructed images and MIPs were obtained and reviewed to evaluate the vascular anatomy. CONTRAST:  100 cc of Isovue 370 IV COMPARISON:  MRI from 12/23/2015, 08/29/2007 ultrasound in FINDINGS: CTA CHEST FINDINGS Cardiovascular: Satisfactory opacification of the pulmonary arteries to the segmental level. No evidence of pulmonary embolism. The heart is enlarged. No pericardial effusion. There is three-vessel coronary arteriosclerosis. No thoracic aortic aneurysm nor dissection. Mediastinum/Nodes: No enlarged mediastinal, hilar, or axillary lymph nodes. Thyroid gland, trachea, and esophagus demonstrate no significant findings. Lungs/Pleura: Bilateral paraseptal and centrilobular emphysema with superimposed ground-glass opacities which may represent sequela mild pulmonary edema. Musculoskeletal: Anterior compression of T7 which appears chronic without evidence of paraspinal hematoma or retropulsion. Degenerative disc disease from T1 through T5 and T7 through T11. Review of the MIP images confirms the above findings. CTA ABDOMEN AND PELVIS FINDINGS VASCULAR Aorta: There is a fusiform infrarenal abdominal aortic aneurysm measuring up to 8.1 cm transverse. The proximal neck measures 2.2 cm in caliber and is approximately 3.5 cm from the left renal artery origin. The distal aspect of the fusiform aortic aneurysm extends to the aortic bifurcation were there is an ectatic appearance of both common iliac arteries, on the right measuring up to 1.9 cm and on the left, appearing more aneurysmal measuring 2.9 cm with mild  circumferential soft plaque. The external iliac artery on the right measures 0.6 cm and on the left 0.8 cm and are patent. No evidence of abdominal aortic leak. No retroperitoneal fluid is seen. Along the left lateral soft plaque are developing calcifications which account for the hyperdensity seen. No communication with intraluminal contrast to suggested an ulcerated plaque. There is a small thrombosed 1.5 cm aneurysm of what appears to be a lumbar artery branch, series 501 image 170. Celiac: Patent without evidence of aneurysm, dissection, vasculitis or significant stenosis. Atherosclerosis is noted at the origin. SMA: Atherosclerosis at the origin of the SMA. No dissection or aneurysm. Renals: Single  renal arteries to both kidneys, attenuated on the right measuring 4 mm in caliber and 9 mm on the left. Small webs are noted in the proximal renal arteries. IMA: Patent without evidence of aneurysm, dissection, vasculitis or significant stenosis. Inflow: Patent without evidence of aneurysm, dissection, vasculitis or significant stenosis.Atherosclerosis of the descending thoracic aorta without aneurysm. Veins: No obvious venous abnormality within the limitations of this arterial phase study. Review of the MIP images confirms the above findings. NON-VASCULAR Hepatobiliary: There are too numerous to count hypodensities within the liver statistically consistent with cysts and/or hemangiomata. There is a granuloma in the right hepatic lobe. The gallbladder is not distended but contains several calculi. No wall thickening is noted. Pancreas: Fatty atrophy of the pancreas without ductal dilatation or focal mass. Spleen: The spleen is not enlarged. Adrenals/Urinary Tract: Normal bilateral adrenal glands. Right worse than left renal cortical atrophy with bilateral simple and complex renal cysts. Extrarenal pelves are seen bilaterally. Urinary bladder is distended without acute abnormalities. Stomach/Bowel: Normal bowel  rotation. No acute inflammation. There is colonic diverticulosis without acute diverticulitis. Lymphatic: No lymphadenopathy. Reproductive: Prostate appears unremarkable. Other: No ascites or pneumoperitoneum. Musculoskeletal: No acute osseous abnormality.  Lumbar spondylosis. Review of the MIP images confirms the above findings. IMPRESSION: 1. Infrarenal fusiform abdominal aortic aneurysm measuring 8 cm transverse without evidence of acute intramural hematoma or aortic leak. Developing calcifications seen within the soft plaque along the left lateral aspect of this aneurysm. The aneurysm is 3.5 cm from the renal arteries it measures approximately 2.2 cm in caliber. The aneurysm terminates at the aortic bifurcation. There is ectasia of the right common iliac artery to 1.9 cm in and more aneurysmal dilatation of the left common iliac artery to 2.9 cm with plaque noted. The external iliac arteries are patent measuring 0.6 cm on the right and 0.8 cm on the left. 2. Numerous hepatic and bilateral renal hypodensities consistent with cysts. Atrophic appearing kidneys bilaterally. 3. No pulmonary embolus or thoracic aortic aneurysm. No aortic dissection. All 4.  Pulmonary emphysema. 5. No acute thoracic abnormality. Chronic appearing T7 compression. Critical Value/emergent results were called by telephone at the time of interpretation on 12/23/2015 at 5:13 pm to Dr. Alvira MondayERIN SCHLOSSMAN , who verbally acknowledged these results and earlier to Dr. Leonides SakeBrian Chen of Vascular Surgery. Electronically Signed   By: Tollie Ethavid  Kwon M.D.   On: 12/23/2015 17:14   (Echo, Carotid, EGD, Colonoscopy, ERCP)    Subjective:   Discharge Exam: Vitals:   12/28/15 1937 12/29/15 0432  BP: 117/75 105/70  Pulse: 79 82  Resp: 20 20  Temp: 98.5 F (36.9 C) 98.2 F (36.8 C)   Vitals:   12/28/15 1331 12/28/15 1332 12/28/15 1937 12/29/15 0432  BP:   117/75 105/70  Pulse:   79 82  Resp:   20 20  Temp:   98.5 F (36.9 C) 98.2 F (36.8 C)   TempSrc:   Oral Oral  SpO2: 94% 93% 96% 96%  Weight:      Height:        General: Pt is alert, awake, not in acute distress Cardiovascular: RRR, S1/S2 +, no rubs, no gallops Respiratory: CTA bilaterally, no wheezing, no rhonchi Abdominal: Soft, NT, ND, bowel sounds + Extremities: no edema, no cyanosis    The results of significant diagnostics from this hospitalization (including imaging, microbiology, ancillary and laboratory) are listed below for reference.     Microbiology: No results found for this or any previous visit (from the past 240  hour(s)).   Labs: BNP (last 3 results) No results for input(s): BNP in the last 8760 hours. Basic Metabolic Panel:  Recent Labs Lab 12/23/15 1645 12/24/15 0311 12/25/15 0423 12/26/15 0822 12/27/15 0324 12/28/15 0316  NA 138 140 140 138 139 140  K 4.2 4.5 4.6 4.7 4.6 4.7  CL 112* 114* 115* 109 111 111  CO2 20* 19* 18* 16* 21* 21*  GLUCOSE 92 91 77 89 91 96  BUN 25* 23* 21* 27* 30* 25*  CREATININE 2.09* 1.97* 2.04* 2.32* 2.25* 2.10*  CALCIUM 9.6 8.8* 9.1 9.6 9.2 9.4  MG 2.2  --  2.0 2.2 2.2 2.2  PHOS  --   --  2.3* 2.5 2.9 2.8   Liver Function Tests:  Recent Labs Lab 12/23/15 1645 12/25/15 0423 12/26/15 0822 12/27/15 0324 12/28/15 0316  AST 22 21 30 17 19   ALT 9* 7* 9* 7* 8*  ALKPHOS 59 53 64 55 58  BILITOT 0.8 0.8 1.0 0.6 0.8  PROT 6.2* 5.3* 6.5 5.4* 5.4*  ALBUMIN 3.7 3.3* 4.1 3.4* 3.4*   No results for input(s): LIPASE, AMYLASE in the last 168 hours. No results for input(s): AMMONIA in the last 168 hours. CBC:  Recent Labs Lab 12/23/15 1445 12/24/15 0311 12/25/15 0423 12/26/15 0822 12/27/15 0324 12/28/15 0316  WBC 10.6* 7.1 8.0 8.9 8.6 8.3  NEUTROABS 7.6  --  5.5 6.1 5.7 5.6  HGB 18.8* 15.3 16.3 18.8* 16.7 16.9  HCT 54.8* 45.6 48.4 55.3* 49.7 50.7  MCV 98.4 98.5 98.4 98.0 98.2 98.4  PLT 161 125* 118* 107* 123* 131*   Cardiac Enzymes: No results for input(s): CKTOTAL, CKMB, CKMBINDEX, TROPONINI in  the last 168 hours. BNP: Invalid input(s): POCBNP CBG: No results for input(s): GLUCAP in the last 168 hours. D-Dimer No results for input(s): DDIMER in the last 72 hours. Hgb A1c No results for input(s): HGBA1C in the last 72 hours. Lipid Profile No results for input(s): CHOL, HDL, LDLCALC, TRIG, CHOLHDL, LDLDIRECT in the last 72 hours. Thyroid function studies No results for input(s): TSH, T4TOTAL, T3FREE, THYROIDAB in the last 72 hours.  Invalid input(s): FREET3 Anemia work up No results for input(s): VITAMINB12, FOLATE, FERRITIN, TIBC, IRON, RETICCTPCT in the last 72 hours. Urinalysis No results found for: COLORURINE, APPEARANCEUR, LABSPEC, PHURINE, GLUCOSEU, HGBUR, BILIRUBINUR, KETONESUR, PROTEINUR, UROBILINOGEN, NITRITE, LEUKOCYTESUR Sepsis Labs Invalid input(s): PROCALCITONIN,  WBC,  LACTICIDVEN Microbiology No results found for this or any previous visit (from the past 240 hour(s)).   Time coordinating discharge: Over 30 minutes  SIGNED:   Alba Cory, MD  [facility] 12/29/2015, 10:14 AM Pager   If 7PM-7AM, please contact night-coverage [email or web address] Password [email or web address]

## 2015-12-29 NOTE — Progress Notes (Signed)
Patient wife has been giving home meds to patient since Sunday night and was told it was o.k. To do it per [name]. Explain earlier tonight hospital policy but patient and wife is adamant  To do it this way because he was never getting his meds. at the right time.

## 2015-12-29 NOTE — Progress Notes (Signed)
Patient Name: [name]. Date of Encounter: 12/29/2015  Primary Cardiologist: New (Dr. Delton SeeNelson)  Hospital Problem List     Active Problems:   Stage 4 chronic kidney disease (HCC)   Abdominal aortic aneurysm (AAA) without rupture (HCC)   Pulmonary hypertension   DOE (dyspnea on exertion)   Muscle weakness (generalized)   Acute right-sided low back pain with sciatica   3-vessel CAD     Subjective   Doing well. Dyspnea at baseline. No chest pain.  Inpatient Medications    Scheduled Meds: . albuterol  2.5 mg Nebulization QID  . allopurinol  300 mg Oral Daily  . aspirin EC  81 mg Oral Daily  . calcium acetate  667 mg Oral BID WC  . fluticasone  1 spray Each Nare Daily  . loratadine  10 mg Oral QHS  . metoprolol succinate  50 mg Oral QHS  . mometasone-formoterol  2 puff Inhalation BID  . multivitamin  1 tablet Oral QHS  . rosuvastatin  20 mg Oral Daily  . sodium bicarbonate  1,300 mg Oral BID  . sodium chloride flush  3 mL Intravenous Q12H   Continuous Infusions:  PRN Meds: sodium chloride, [DISCONTINUED] acetaminophen **OR** acetaminophen, acetaminophen, ondansetron (ZOFRAN) IV, sodium chloride flush, traMADol   Vital Signs    Vitals:   12/28/15 1331 12/28/15 1332 12/28/15 1937 12/29/15 0432  BP:   117/75 105/70  Pulse:   79 82  Resp:   20 20  Temp:   98.5 F (36.9 C) 98.2 F (36.8 C)  TempSrc:   Oral Oral  SpO2: 94% 93% 96% 96%  Weight:      Height:        Intake/Output Summary (Last 24 hours) at 12/29/15 0910 Last data filed at 12/28/15 1800  Gross per 24 hour  Intake              840 ml  Output                0 ml  Net              840 ml   Filed Weights   12/23/15 1448 12/23/15 2029 12/24/15 0600  Weight: 162 lb (73.5 kg) 159 lb 14.4 oz (72.5 kg) 163 lb 9.6 oz (74.2 kg)    Physical Exam    GEN: Well nourished, well developed, in no acute distress.  HEENT: Grossly normal.  Neck: Supple, + JVD. No  carotid bruits, or masses. Cardiac:  RRR, systolic cmurmur. No  rubs, or gallops. No clubbing, cyanosis, edema.  Radials/DP/PT 2+ and equal bilaterally.  Respiratory:  Respirations regular and unlabored, clear to auscultation bilaterally. GI: Soft, nontender, nondistended, BS + x 4. MS: no deformity or atrophy. Skin: warm and dry, no rash. Neuro:  Strength and sensation are intact. Psych: AAOx3.  Normal affect.  Labs    CBC  Recent Labs  12/27/15 0324 12/28/15 0316  WBC 8.6 8.3  NEUTROABS 5.7 5.6  HGB 16.7 16.9  HCT 49.7 50.7  MCV 98.2 98.4  PLT 123* 131*   Basic Metabolic Panel  Recent Labs  12/27/15 0324 12/28/15 0316  NA 139 140  K 4.6 4.7  CL 111 111  CO2 21* 21*  GLUCOSE 91 96  BUN 30* 25*  CREATININE 2.25* 2.10*  CALCIUM 9.2 9.4  MG 2.2 2.2  PHOS 2.9 2.8   Liver Function Tests  Recent Labs  12/27/15 0324 12/28/15 0316  AST 17 19  ALT  7* 8*  ALKPHOS 55 58  BILITOT 0.6 0.8  PROT 5.4* 5.4*  ALBUMIN 3.4* 3.4*   Telemetry    [name]  ECG    N/A  Radiology    Nm Myocar Multi W/spect W/wall Motion / Ef  Result Date: 12/27/2015  There was no ST segment deviation noted during stress.  No T wave inversion was noted during stress.  Defect 1: There is a defect present in the basal inferolateral, mid inferolateral and apical lateral location.  Findings consistent with ischemia.  This is an intermediate risk study.  The left ventricular ejection fraction is normal (55-65%).  There is a medium size, moderate severity reversible defect in the basal and mid inferolateral and apical lateral walls consistent with ischemia (SDS =4).    Cardiac Studies   Angio Chest/abd/pel For Dissection W And/or W/wo  Result Date: 12/23/2015 CLINICAL DATA:  Abdominal aortic aneurysm with worsening back pain.  IMPRESSION: 1. Infrarenal fusiform abdominal aortic aneurysm measuring 8 cm transverse without evidence of acute intramural hematoma or aortic leak. Developing calcifications seen within the soft  plaque along the left lateral aspect of this aneurysm. The aneurysm is 3.5 cm from the renal arteries it measures approximately 2.2 cm in caliber. The aneurysm terminates at the aortic bifurcation. There is ectasia of the right common iliac artery to 1.9 cm in and more aneurysmal dilatation of the left common iliac artery to 2.9 cm with plaque noted. The external iliac arteries are patent measuring 0.6 cm on the right and 0.8 cm on the left. 2. Numerous hepatic and bilateral renal hypodensities consistent with cysts. Atrophic appearing kidneys bilaterally. 3. No pulmonary embolus or thoracic aortic aneurysm. No aortic dissection. All 4.  Pulmonary emphysema. 5. No acute thoracic abnormality. Chronic appearing T7 compression.   TELE: [name]  TTE: 12/25/15 - Left ventricle: The cavity size was normal. Wall thickness was normal. Systolic function was normal. The estimated ejection fraction was in the range of 55% to 60%. Wall motion was normal; there were no regional wall motion abnormalities. Doppler parameters are consistent with abnormal left ventricular relaxation (grade 1 diastolic dysfunction). - Aortic root: The aortic root was mildly dilated. - Pulmonary arteries: Systolic pressure was severely increased. PA peak pressure: 70 mm Hg (S).  Impressions: - Normal LV systolic function; grade 1 diastolic dysfunction; mildly dilated aortic root; trace MR; mild TR with severely elevated pulmonary pressure.  Pending Myoview  Patient Profile     [name].is a 81 y.o.malewith medical history significant of chronic kidney disease stage IV, hypertension, hyperlipidemia here with large AAA who schedule for a surgery on 01/09/16.   Assessment & Plan    1. AAA - For surgery 01/09/16. Echo showed normal LVEF, grade 1 diastolic dysfunction, but severe pulonary hypertension. - CT of chest suggesting COPD (seen by pulmonary) and 3 V coronary calcification. Pending Myoview  result.   2. HLD - Continue Crestor. Unknown LDL.   3. HTN - Stable on current medications.   81 year old male with a large AAA, schedule for a surgery on 01/09/16, with normal LVEF, grade 1 diastolic dysfunction, but severe pulonary hypertension. After reviewing his Chest CT - all sec to COPD, he is no treatment like home O2, Spiriva, I would recommend to call pulmonary to optimize him before surgery. Chest CT also showed 3 vessel coronary calcifications, he reports SOB but has severe pulmonary hypertension. The patient has an abnormal stress test consistent with ischemia, LHC showed 3 VD, he  was evaluated by CT surgery yesterday who stated that he would not benefit from CABG and is not a candidate for surgical coronary revascularization as he is not significantly symptomatic and is considered a high risk for surgery sec to end-stage lung and renal disease.  He can be discharged today, we will continue aspirin, rosuvastatin and metoprolol.   [name] 12/29/2015

## 2016-01-02 ENCOUNTER — Other Ambulatory Visit: Payer: Self-pay | Admitting: *Deleted

## 2016-01-03 ENCOUNTER — Other Ambulatory Visit: Payer: Self-pay

## 2016-01-04 ENCOUNTER — Encounter (HOSPITAL_COMMUNITY): Payer: Self-pay

## 2016-01-04 ENCOUNTER — Encounter (HOSPITAL_COMMUNITY)
Admission: RE | Admit: 2016-01-04 | Discharge: 2016-01-04 | Disposition: A | Discharge status: Home / Self Care | Payer: Medicare Other | Source: Ambulatory Visit | Attending: Vascular Surgery | Admitting: Vascular Surgery

## 2016-01-04 ENCOUNTER — Other Ambulatory Visit (HOSPITAL_COMMUNITY): Payer: Self-pay | Admitting: *Deleted

## 2016-01-04 HISTORY — DX: Pneumonia, unspecified organism: J18.9

## 2016-01-04 HISTORY — DX: Abdominal aortic aneurysm, without rupture, unspecified: I71.40

## 2016-01-04 HISTORY — DX: Dyspnea, unspecified: R06.00

## 2016-01-04 HISTORY — DX: Abdominal aortic aneurysm, without rupture: I71.4

## 2016-01-04 HISTORY — DX: Atherosclerotic heart disease of native coronary artery without angina pectoris: I25.10

## 2016-01-04 LAB — TYPE AND SCREEN
ABO/RH(D): A POS
ANTIBODY SCREEN: NEGATIVE

## 2016-01-04 LAB — URINALYSIS, ROUTINE W REFLEX MICROSCOPIC
BILIRUBIN URINE: NEGATIVE
GLUCOSE, UA: NEGATIVE mg/dL
KETONES UR: NEGATIVE mg/dL
LEUKOCYTES UA: NEGATIVE
Nitrite: NEGATIVE
PROTEIN: 30 mg/dL — AB
Specific Gravity, Urine: 1.018 (ref 1.005–1.030)
pH: 6 (ref 5.0–8.0)

## 2016-01-04 LAB — URINE MICROSCOPIC-ADD ON

## 2016-01-04 LAB — SURGICAL PCR SCREEN
MRSA, PCR: NEGATIVE
Staphylococcus aureus: POSITIVE — AB

## 2016-01-04 LAB — PROTIME-INR
INR: 1.06
PROTHROMBIN TIME: 13.9 s (ref 11.4–15.2)

## 2016-01-04 LAB — APTT: aPTT: 37 seconds — ABNORMAL HIGH (ref 24–36)

## 2016-01-04 NOTE — Progress Notes (Signed)
Anesthesia Chart Review:  Pt is a 81 year old male scheduled for endovascular stent graft for AAA on 01/05/2016 with Leonides SakeBrian Chen, MD  - PCP is Simone CuriaKeung [name], MD - Nephrologist is Beryle LatheJames Deterding, MD  PMH includes:  CAD, HTN, AAA, pulmonary HTN, CKD (stage IV, has fistula, not yet on dialysis)  Pt hospitalized 11/17-11/23/17 for incidental finding of AAA on MRI. Had CTA, hospitalized for monitoring after IV contrast due to advanced CKD.  Cardiology saw pt for pre-op eval, cardiac cath (see below), showed severe 3 vessel disease.  CT surgery felt pt not a candidate for CABG and pt is to be medically managed. Hospital stay complicated by acute hypoxic respiratory failure likely due to pulmonary HTN and emphysema. Pt saw pulmonlogy in hospital, ILD suspected and this will be worked up as outpatient. Pt sent home on oxygen at all times.   Medications include: ASA, metoprolol, dulera, crestor, albuterol  Preoperative labs: PT/PTT, UA acceptable for surgery.  Labs from hospitalization 12/28/15 also reviewed:   - Cr 2.1, BUN 25; consistent with prior results  CT angio chest 12/23/15:  1. Infrarenal fusiform abdominal aortic aneurysm measuring 8 cm transverse without evidence of acute intramural hematoma or aortic leak. Developing calcifications seen within the soft plaque along the left lateral aspect of this aneurysm. The aneurysm is 3.5 cm from the renal arteries it measures approximately 2.2 cm in caliber. The aneurysm terminates at the aortic bifurcation. There is ectasia of the right common iliac artery to 1.9 cm in and more aneurysmal dilatation of the left common iliac artery to 2.9 cm with plaque noted. The external iliac arteries are patent measuring 0.6 cm on the right and 0.8 cm on the left. 2. Numerous hepatic and bilateral renal hypodensities consistent with cysts. Atrophic appearing kidneys bilaterally. 3. No pulmonary embolus or thoracic aortic aneurysm. No aortic dissection.  4.  Pulmonary  emphysema. 5. No acute thoracic abnormality. Chronic appearing T7 compression.  EKG 12/23/15: Sinus rhythm. Borderline prolonged PR interval. Probable left atrial enlargement. LAFB. Consider anterior infarct. Baseline wander in lead(s) V2 V6  R and L cardiac cath 12/27/15:   Prox RCA to Mid RCA lesion, 50 %stenosed.  Mid RCA lesion, 95 %stenosed.  Dist RCA-2 lesion, 70 %stenosed.  Dist RCA-1 lesion, 90 %stenosed.  Ost LM to LM lesion, 40 %stenosed.  LM lesion, 50 %stenosed.  Mid Cx to Dist Cx lesion, 95 %stenosed.  Mid LAD to Dist LAD lesion, 90 %stenosed.  Dist LAD lesion, 50 %stenosed.   Findings: Ao = 124/70 (93) RA =  3 RV = 60/5 PA = 56/16 (34) PCW = 9 Fick cardiac output/index = 4.7/2.6 PVR = 5.4 WU FA sat = 91% PA sat = 68%  Assessment: 1. Severe 3v CAD not amenable to PCI 2. Mild PAH with normal cardiac output and no evidence of RV strain 3. Normal LV function by echo  Plan/Discussion: He has severe 3v CAD not amenable to PCI. Will have TCTS assess prior to EVAR. If felt not to be candidate for CABG would proceed with medical therapy and high-risk EVAR.  Echo 12/25/15:  - Left ventricle: The cavity size was normal. Wall thickness was normal. Systolic function was normal. The estimated ejection fraction was in the range of 55% to 60%. Wall motion was normal; there were no regional wall motion abnormalities. Doppler parameters are consistent with abnormal left ventricular relaxation (grade 1 diastolic dysfunction). - Aortic root: The aortic root was mildly dilated. - Pulmonary arteries: Systolic pressure  was severely increased. PA peak pressure: 70 mm Hg (S). - Impressions: Normal LV systolic function; grade 1 diastolic dysfunction; mildly dilated aortic root; trace MR; mild TR with severely elevated pulmonary pressure.  Pt with significant cardiac and pulmonary problems, seen last week by cardiology and pulmonology while inpatient.  Proceeding with EVAR  recommended.   If no changes, I anticipate pt can proceed with surgery as scheduled.   Rica Mastngela Katya Rolston, FNP-BC [facility]iology Phone: [phone] 01/04/2016 4:27 PM

## 2016-01-04 NOTE — Pre-Procedure Instructions (Signed)
Allene Pyoavid G Meckel Sr.  01/04/2016    Your procedure is scheduled on Thursday, January 05, 2016 at 7:30 AM.   [facility] at 5:30 AM.   Call this number if you have problems the morning of surgery: [phone]   Remember:  Do not eat food or drink liquids after midnight tonight.  Take these medicines the morning of surgery with A SIP OF WATER: Aspirin, Tylenol - if needed,  Dulera inhaler, Ventolin inhaler - if needed (bring this inhaler with you in the morning)   Do not wear jewelry.  Do not wear lotions, powders, cologne, or deodorant.  Men may shave face and neck.  Do not bring valuables to the hospital.  [facility] is not responsible for any belongings or valuables.  Contacts, dentures or bridgework may not be worn into surgery.  Leave your suitcase in the car.  After surgery it may be brought to your room.  For patients admitted to the hospital, discharge time will be determined by your treatment team.  Special instructions: [place] - Preparing for Surgery  Before surgery, you can play an important role.  Because skin is not sterile, your skin needs to be as free of germs as possible.  You can reduce the number of germs on you skin by washing with CHG (chlorahexidine gluconate) soap before surgery.  CHG is an antiseptic cleaner which kills germs and bonds with the skin to continue killing germs even after washing.  Please DO NOT use if you have an allergy to CHG or antibacterial soaps.  If your skin becomes reddened/irritated stop using the CHG and inform your nurse when you arrive at Short Stay.  Do not shave (including legs and underarms) for at least 48 hours prior to the first CHG shower.  You may shave your face.  Please follow these instructions carefully:   1.  Shower with CHG Soap the night before surgery and the                    morning of Surgery.  2.  If you choose to wash your hair, wash your hair first as  usual with your       normal shampoo.  3.  After you shampoo, rinse your hair and body thoroughly to remove the shampoo.  4.  Use CHG as you would any other liquid soap.  You can apply chg directly       to the skin and wash gently with scrungie or a clean washcloth.  5.  Apply the CHG Soap to your body ONLY FROM THE NECK DOWN.        Do not use on open wounds or open sores.  Avoid contact with your eyes, ears, mouth and genitals (private parts).  Wash genitals (private parts) with your normal soap.  6.  Wash thoroughly, paying special attention to the area where your surgery        will be performed.  7.  Thoroughly rinse your body with warm water from the neck down.  8.  DO NOT shower/wash with your normal soap after using and rinsing off       the CHG Soap.  9.  Pat yourself dry with a clean towel.            10.  Wear clean pajamas.            11.  Place clean sheets on your bed  the night of your first shower and do not        sleep with pets.  Day of Surgery  Do not apply any lotions/deodorants the morning of surgery.  Please wear clean clothes to the hospital.   Please read over the fact sheets that you were given.

## 2016-01-05 ENCOUNTER — Other Ambulatory Visit: Payer: Self-pay | Admitting: *Deleted

## 2016-01-05 ENCOUNTER — Inpatient Hospital Stay (HOSPITAL_COMMUNITY): Payer: Medicare Other | Admitting: Emergency Medicine

## 2016-01-05 ENCOUNTER — Encounter (HOSPITAL_COMMUNITY): Payer: Self-pay | Admitting: General Practice

## 2016-01-05 ENCOUNTER — Encounter (HOSPITAL_COMMUNITY)
Admission: RE | Disposition: A | Discharge status: Home Health | Payer: Self-pay | Source: Home / Self Care | Attending: Vascular Surgery

## 2016-01-05 ENCOUNTER — Inpatient Hospital Stay (HOSPITAL_COMMUNITY): Payer: Medicare Other

## 2016-01-05 ENCOUNTER — Inpatient Hospital Stay (HOSPITAL_COMMUNITY)
Admission: RE | Admit: 2016-01-05 | Discharge: 2016-01-06 | DRG: 269 | Disposition: A | Discharge status: Home Health | Payer: Medicare Other | Attending: Vascular Surgery | Admitting: Vascular Surgery

## 2016-01-05 ENCOUNTER — Inpatient Hospital Stay (HOSPITAL_COMMUNITY): Payer: Medicare Other | Admitting: Certified Registered"

## 2016-01-05 DIAGNOSIS — Z95828 Presence of other vascular implants and grafts: Secondary | ICD-10-CM

## 2016-01-05 DIAGNOSIS — Z8679 Personal history of other diseases of the circulatory system: Secondary | ICD-10-CM

## 2016-01-05 DIAGNOSIS — N184 Chronic kidney disease, stage 4 (severe): Secondary | ICD-10-CM | POA: Diagnosis not present

## 2016-01-05 DIAGNOSIS — Z79899 Other long term (current) drug therapy: Secondary | ICD-10-CM

## 2016-01-05 DIAGNOSIS — Z9981 Dependence on supplemental oxygen: Secondary | ICD-10-CM | POA: Diagnosis not present

## 2016-01-05 DIAGNOSIS — I2510 Atherosclerotic heart disease of native coronary artery without angina pectoris: Secondary | ICD-10-CM | POA: Diagnosis not present

## 2016-01-05 DIAGNOSIS — Y838 Other surgical procedures as the cause of abnormal reaction of the patient, or of later complication, without mention of misadventure at the time of the procedure: Secondary | ICD-10-CM | POA: Diagnosis not present

## 2016-01-05 DIAGNOSIS — N2589 Other disorders resulting from impaired renal tubular function: Secondary | ICD-10-CM | POA: Diagnosis present

## 2016-01-05 DIAGNOSIS — Z823 Family history of stroke: Secondary | ICD-10-CM

## 2016-01-05 DIAGNOSIS — M549 Dorsalgia, unspecified: Secondary | ICD-10-CM | POA: Diagnosis present

## 2016-01-05 DIAGNOSIS — M6281 Muscle weakness (generalized): Secondary | ICD-10-CM | POA: Diagnosis not present

## 2016-01-05 DIAGNOSIS — I129 Hypertensive chronic kidney disease with stage 1 through stage 4 chronic kidney disease, or unspecified chronic kidney disease: Secondary | ICD-10-CM | POA: Diagnosis present

## 2016-01-05 DIAGNOSIS — Z87891 Personal history of nicotine dependence: Secondary | ICD-10-CM

## 2016-01-05 DIAGNOSIS — I714 Abdominal aortic aneurysm, without rupture, unspecified: Secondary | ICD-10-CM

## 2016-01-05 DIAGNOSIS — L8990 Pressure ulcer of unspecified site, unspecified stage: Secondary | ICD-10-CM | POA: Insufficient documentation

## 2016-01-05 DIAGNOSIS — Z888 Allergy status to other drugs, medicaments and biological substances status: Secondary | ICD-10-CM | POA: Diagnosis not present

## 2016-01-05 DIAGNOSIS — N2581 Secondary hyperparathyroidism of renal origin: Secondary | ICD-10-CM | POA: Diagnosis not present

## 2016-01-05 DIAGNOSIS — I270 Primary pulmonary hypertension: Secondary | ICD-10-CM | POA: Diagnosis not present

## 2016-01-05 DIAGNOSIS — Z833 Family history of diabetes mellitus: Secondary | ICD-10-CM

## 2016-01-05 DIAGNOSIS — D62 Acute posthemorrhagic anemia: Secondary | ICD-10-CM | POA: Diagnosis not present

## 2016-01-05 DIAGNOSIS — Z7982 Long term (current) use of aspirin: Secondary | ICD-10-CM | POA: Diagnosis not present

## 2016-01-05 DIAGNOSIS — Z7951 Long term (current) use of inhaled steroids: Secondary | ICD-10-CM

## 2016-01-05 DIAGNOSIS — M1990 Unspecified osteoarthritis, unspecified site: Secondary | ICD-10-CM | POA: Diagnosis not present

## 2016-01-05 DIAGNOSIS — I517 Cardiomegaly: Secondary | ICD-10-CM | POA: Diagnosis not present

## 2016-01-05 DIAGNOSIS — S301XXA Contusion of abdominal wall, initial encounter: Secondary | ICD-10-CM | POA: Diagnosis not present

## 2016-01-05 HISTORY — PX: ENDOVASCULAR STENT INSERTION: SHX5161

## 2016-01-05 LAB — BASIC METABOLIC PANEL
Anion gap: 7 (ref 5–15)
BUN: 26 mg/dL — AB (ref 6–20)
CHLORIDE: 110 mmol/L (ref 101–111)
CO2: 20 mmol/L — AB (ref 22–32)
CREATININE: 1.8 mg/dL — AB (ref 0.61–1.24)
Calcium: 8.4 mg/dL — ABNORMAL LOW (ref 8.9–10.3)
GFR calc Af Amer: 40 mL/min — ABNORMAL LOW (ref >60)
GFR calc non Af Amer: 34 mL/min — ABNORMAL LOW (ref >60)
GLUCOSE: 95 mg/dL (ref 65–99)
Potassium: 4.8 mmol/L (ref 3.5–5.1)
Sodium: 137 mmol/L (ref 135–145)

## 2016-01-05 LAB — POCT I-STAT 7, (LYTES, BLD GAS, ICA,H+H)
ACID-BASE DEFICIT: 3 mmol/L — AB (ref 0.0–2.0)
Bicarbonate: 21.8 mmol/L (ref 20.0–28.0)
Calcium, Ion: 1.31 mmol/L (ref 1.15–1.40)
HEMATOCRIT: 52 % (ref 39.0–52.0)
HEMOGLOBIN: 17.7 g/dL — AB (ref 13.0–17.0)
O2 Saturation: 96 %
PH ART: 7.373 (ref 7.350–7.450)
PO2 ART: 81 mmHg — AB (ref 83.0–108.0)
POTASSIUM: 4.3 mmol/L (ref 3.5–5.1)
SODIUM: 141 mmol/L (ref 135–145)
TCO2: 23 mmol/L (ref 0–100)
pCO2 arterial: 37.6 mmHg (ref 32.0–48.0)

## 2016-01-05 LAB — CBC
HCT: 49 % (ref 39.0–52.0)
HEMATOCRIT: 45.6 % (ref 39.0–52.0)
Hemoglobin: 15.2 g/dL (ref 13.0–17.0)
Hemoglobin: 16.2 g/dL (ref 13.0–17.0)
MCH: 32.8 pg (ref 26.0–34.0)
MCH: 32.7 pg (ref 26.0–34.0)
MCHC: 33.3 g/dL (ref 30.0–36.0)
MCHC: 33.1 g/dL (ref 30.0–36.0)
MCV: 98.5 fL (ref 78.0–100.0)
MCV: 98.8 fL (ref 78.0–100.0)
PLATELETS: 116 10*3/uL — AB (ref 150–400)
Platelets: 103 10*3/uL — ABNORMAL LOW (ref 150–400)
RBC: 4.63 MIL/uL (ref 4.22–5.81)
RBC: 4.96 MIL/uL (ref 4.22–5.81)
RDW: 15.3 % (ref 11.5–15.5)
RDW: 15 % (ref 11.5–15.5)
WBC: 7.7 10*3/uL (ref 4.0–10.5)
WBC: 11.3 10*3/uL — AB (ref 4.0–10.5)

## 2016-01-05 LAB — MAGNESIUM: Magnesium: 2 mg/dL (ref 1.7–2.4)

## 2016-01-05 LAB — APTT: aPTT: 39 seconds — ABNORMAL HIGH (ref 24–36)

## 2016-01-05 LAB — CREATININE, SERUM
CREATININE: 1.86 mg/dL — AB (ref 0.61–1.24)
GFR, EST AFRICAN AMERICAN: 38 mL/min — AB (ref >60)
GFR, EST NON AFRICAN AMERICAN: 33 mL/min — AB (ref >60)

## 2016-01-05 LAB — PROTIME-INR
INR: 1.14
Prothrombin Time: 14.7 seconds (ref 11.4–15.2)

## 2016-01-05 SURGERY — ENDOVASCULAR STENT GRAFT INSERTION
Anesthesia: Spinal | Site: Abdomen

## 2016-01-05 MED ORDER — ONDANSETRON HCL 4 MG/2ML IJ SOLN: 4.0000 mg | Freq: Four times a day (QID) | INTRAMUSCULAR | Status: DC | PRN

## 2016-01-05 MED ORDER — PHENYLEPHRINE HCL 10 MG/ML IJ SOLN
INTRAVENOUS | Status: DC | PRN
  Administered 2016-01-05: 40 ug/min via INTRAVENOUS

## 2016-01-05 MED ORDER — MIDAZOLAM HCL 2 MG/2ML IJ SOLN
INTRAMUSCULAR | Status: AC
  Filled 2016-01-05: qty 2

## 2016-01-05 MED ORDER — MUPIROCIN 2 % EX OINT
1.0000 "application " | TOPICAL_OINTMENT | Freq: Two times a day (BID) | CUTANEOUS | Status: DC
  Administered 2016-01-05 – 2016-01-06 (×2): 1 via NASAL

## 2016-01-05 MED ORDER — MIDAZOLAM HCL 5 MG/5ML IJ SOLN
INTRAMUSCULAR | Status: DC | PRN
  Administered 2016-01-05 (×2): 1 mg via INTRAVENOUS

## 2016-01-05 MED ORDER — SENNOSIDES-DOCUSATE SODIUM 8.6-50 MG PO TABS: 1.0000 | ORAL_TABLET | Freq: Every evening | ORAL | Status: DC | PRN

## 2016-01-05 MED ORDER — DEXTROSE 5 % IV SOLN
1.5000 g | Freq: Two times a day (BID) | INTRAVENOUS | Status: AC
  Administered 2016-01-05 – 2016-01-06 (×2): 1.5 g via INTRAVENOUS
  Filled 2016-01-05 (×2): qty 1.5

## 2016-01-05 MED ORDER — CALCIUM ACETATE (PHOS BINDER) 667 MG PO CAPS
667.0000 mg | ORAL_CAPSULE | Freq: Two times a day (BID) | ORAL | Status: DC
  Administered 2016-01-05 – 2016-01-06 (×2): 667 mg via ORAL
  Filled 2016-01-05 (×3): qty 1

## 2016-01-05 MED ORDER — SODIUM CHLORIDE 0.9 % IV SOLN
INTRAVENOUS | Status: DC | PRN
  Administered 2016-01-05: 500 mL

## 2016-01-05 MED ORDER — PROPOFOL 10 MG/ML IV BOLUS
INTRAVENOUS | Status: AC
  Filled 2016-01-05: qty 20

## 2016-01-05 MED ORDER — MUPIROCIN 2 % EX OINT
TOPICAL_OINTMENT | CUTANEOUS | Status: AC
  Filled 2016-01-05: qty 22

## 2016-01-05 MED ORDER — BUPIVACAINE HCL (PF) 0.5 % IJ SOLN
INTRAMUSCULAR | Status: DC | PRN
  Administered 2016-01-05: 3 mL via INTRATHECAL

## 2016-01-05 MED ORDER — DEXTROSE 5 % IV SOLN
INTRAVENOUS | Status: AC
  Filled 2016-01-05: qty 1.5

## 2016-01-05 MED ORDER — HEPARIN SODIUM (PORCINE) 1000 UNIT/ML IJ SOLN
INTRAMUSCULAR | Status: DC | PRN
  Administered 2016-01-05: 8 mL via INTRAVENOUS

## 2016-01-05 MED ORDER — OXYCODONE HCL 5 MG PO TABS: 5.0000 mg | ORAL_TABLET | Freq: Four times a day (QID) | ORAL | 0 refills | Status: DC | PRN

## 2016-01-05 MED ORDER — SODIUM CHLORIDE 0.9 % IV SOLN
INTRAVENOUS | Status: DC
  Administered 2016-01-05: 15:00:00 via INTRAVENOUS

## 2016-01-05 MED ORDER — FENTANYL CITRATE (PF) 250 MCG/5ML IJ SOLN
INTRAMUSCULAR | Status: AC
  Filled 2016-01-05: qty 5

## 2016-01-05 MED ORDER — HEPARIN SODIUM (PORCINE) 5000 UNIT/ML IJ SOLN
5000.0000 [IU] | Freq: Three times a day (TID) | INTRAMUSCULAR | Status: DC
  Filled 2016-01-05: qty 1

## 2016-01-05 MED ORDER — DEXTROSE 5 % IV SOLN
1.5000 g | INTRAVENOUS | Status: AC
  Administered 2016-01-05: 1.5 g via INTRAVENOUS

## 2016-01-05 MED ORDER — MORPHINE SULFATE (PF) 2 MG/ML IV SOLN: 2.0000 mg | INTRAVENOUS | Status: DC | PRN

## 2016-01-05 MED ORDER — IODIXANOL 320 MG/ML IV SOLN
INTRAVENOUS | Status: DC | PRN
  Administered 2016-01-05: 94.9 mL via INTRAVENOUS

## 2016-01-05 MED ORDER — SODIUM BICARBONATE 650 MG PO TABS
1300.0000 mg | ORAL_TABLET | Freq: Two times a day (BID) | ORAL | Status: DC
  Administered 2016-01-05 – 2016-01-06 (×3): 1300 mg via ORAL
  Filled 2016-01-05 (×3): qty 2

## 2016-01-05 MED ORDER — NITROGLYCERIN 0.4 MG SL SUBL: 0.4000 mg | SUBLINGUAL_TABLET | SUBLINGUAL | Status: DC | PRN

## 2016-01-05 MED ORDER — CHLORHEXIDINE GLUCONATE CLOTH 2 % EX PADS: 6.0000 | MEDICATED_PAD | Freq: Once | CUTANEOUS | Status: DC

## 2016-01-05 MED ORDER — 0.9 % SODIUM CHLORIDE (POUR BTL) OPTIME
TOPICAL | Status: DC | PRN
  Administered 2016-01-05: 1000 mL

## 2016-01-05 MED ORDER — POTASSIUM CHLORIDE CRYS ER 20 MEQ PO TBCR: 20.0000 meq | EXTENDED_RELEASE_TABLET | Freq: Every day | ORAL | Status: DC | PRN

## 2016-01-05 MED ORDER — DOCUSATE SODIUM 100 MG PO CAPS
100.0000 mg | ORAL_CAPSULE | Freq: Every day | ORAL | Status: DC
  Filled 2016-01-05: qty 1

## 2016-01-05 MED ORDER — GUAIFENESIN-DM 100-10 MG/5ML PO SYRP: 15.0000 mL | ORAL_SOLUTION | ORAL | Status: DC | PRN

## 2016-01-05 MED ORDER — MOMETASONE FURO-FORMOTEROL FUM 100-5 MCG/ACT IN AERO
2.0000 | INHALATION_SPRAY | Freq: Two times a day (BID) | RESPIRATORY_TRACT | Status: DC
  Administered 2016-01-06: 2 via RESPIRATORY_TRACT
  Filled 2016-01-05: qty 8.8

## 2016-01-05 MED ORDER — NEPHRO-VITE 0.8 MG PO TABS
1.0000 | ORAL_TABLET | Freq: Every day | ORAL | Status: DC
  Administered 2016-01-05: 1 via ORAL
  Filled 2016-01-05 (×3): qty 1

## 2016-01-05 MED ORDER — LABETALOL HCL 5 MG/ML IV SOLN
10.0000 mg | INTRAVENOUS | Status: DC | PRN
  Filled 2016-01-05: qty 4

## 2016-01-05 MED ORDER — FENTANYL CITRATE (PF) 100 MCG/2ML IJ SOLN: 25.0000 ug | INTRAMUSCULAR | Status: DC | PRN

## 2016-01-05 MED ORDER — METOPROLOL SUCCINATE ER 50 MG PO TB24
50.0000 mg | ORAL_TABLET | Freq: Every day | ORAL | Status: DC
  Administered 2016-01-05: 50 mg via ORAL
  Filled 2016-01-05: qty 1

## 2016-01-05 MED ORDER — PROTAMINE SULFATE 10 MG/ML IV SOLN
INTRAVENOUS | Status: AC
  Filled 2016-01-05: qty 5

## 2016-01-05 MED ORDER — CHLORHEXIDINE GLUCONATE CLOTH 2 % EX PADS
6.0000 | MEDICATED_PAD | Freq: Every day | CUTANEOUS | Status: DC
  Administered 2016-01-05 – 2016-01-06 (×2): 6 via TOPICAL

## 2016-01-05 MED ORDER — PANTOPRAZOLE SODIUM 40 MG PO TBEC
40.0000 mg | DELAYED_RELEASE_TABLET | Freq: Every day | ORAL | Status: DC
  Administered 2016-01-05 – 2016-01-06 (×2): 40 mg via ORAL
  Filled 2016-01-05 (×2): qty 1

## 2016-01-05 MED ORDER — MUPIROCIN 2 % EX OINT
1.0000 "application " | TOPICAL_OINTMENT | Freq: Once | CUTANEOUS | Status: AC
  Administered 2016-01-05: 1 via TOPICAL

## 2016-01-05 MED ORDER — FLUTICASONE PROPIONATE 50 MCG/ACT NA SUSP
1.0000 | Freq: Every day | NASAL | Status: DC
  Administered 2016-01-05 – 2016-01-06 (×2): 1 via NASAL
  Filled 2016-01-05: qty 16

## 2016-01-05 MED ORDER — OXYCODONE HCL 5 MG PO TABS: 5.0000 mg | ORAL_TABLET | ORAL | Status: DC | PRN

## 2016-01-05 MED ORDER — ROCURONIUM BROMIDE 10 MG/ML (PF) SYRINGE
PREFILLED_SYRINGE | INTRAVENOUS | Status: AC
  Filled 2016-01-05: qty 10

## 2016-01-05 MED ORDER — BISACODYL 10 MG RE SUPP: 10.0000 mg | Freq: Every day | RECTAL | Status: DC | PRN

## 2016-01-05 MED ORDER — SODIUM CHLORIDE 0.9 % IV SOLN: 500.0000 mL | Freq: Once | INTRAVENOUS | Status: DC | PRN

## 2016-01-05 MED ORDER — HYDRALAZINE HCL 20 MG/ML IJ SOLN
5.0000 mg | INTRAMUSCULAR | Status: DC | PRN
  Filled 2016-01-05: qty 0.25

## 2016-01-05 MED ORDER — SODIUM CHLORIDE 0.9 % IV SOLN
INTRAVENOUS | Status: DC
  Administered 2016-01-05: 07:00:00 via INTRAVENOUS

## 2016-01-05 MED ORDER — LACTATED RINGERS IV SOLN
INTRAVENOUS | Status: DC | PRN
  Administered 2016-01-05: 08:00:00 via INTRAVENOUS

## 2016-01-05 MED ORDER — ALBUTEROL SULFATE (2.5 MG/3ML) 0.083% IN NEBU: 2.5000 mg | INHALATION_SOLUTION | RESPIRATORY_TRACT | Status: DC | PRN

## 2016-01-05 MED ORDER — LORATADINE 10 MG PO TABS
10.0000 mg | ORAL_TABLET | Freq: Every day | ORAL | Status: DC
  Administered 2016-01-05: 10 mg via ORAL
  Filled 2016-01-05: qty 1

## 2016-01-05 MED ORDER — ALLOPURINOL 300 MG PO TABS
300.0000 mg | ORAL_TABLET | Freq: Every day | ORAL | Status: DC
  Administered 2016-01-05 – 2016-01-06 (×2): 300 mg via ORAL
  Filled 2016-01-05 (×2): qty 1

## 2016-01-05 MED ORDER — ORAL CARE MOUTH RINSE
15.0000 mL | Freq: Two times a day (BID) | OROMUCOSAL | Status: DC
  Administered 2016-01-06: 15 mL via OROMUCOSAL

## 2016-01-05 MED ORDER — PHENOL 1.4 % MT LIQD: 1.0000 | OROMUCOSAL | Status: DC | PRN

## 2016-01-05 MED ORDER — ASPIRIN EC 81 MG PO TBEC
81.0000 mg | DELAYED_RELEASE_TABLET | Freq: Every day | ORAL | Status: DC
  Administered 2016-01-06: 81 mg via ORAL
  Filled 2016-01-05: qty 1

## 2016-01-05 MED ORDER — FENTANYL CITRATE (PF) 100 MCG/2ML IJ SOLN
INTRAMUSCULAR | Status: DC | PRN
  Administered 2016-01-05: 50 ug via INTRAVENOUS

## 2016-01-05 MED ORDER — MOMETASONE FURO-FORMOTEROL FUM 100-5 MCG/ACT IN AERO
2.0000 | INHALATION_SPRAY | Freq: Every day | RESPIRATORY_TRACT | Status: DC
  Filled 2016-01-05: qty 8.8

## 2016-01-05 MED ORDER — HEPARIN SODIUM (PORCINE) 1000 UNIT/ML IJ SOLN
INTRAMUSCULAR | Status: AC
  Filled 2016-01-05: qty 1

## 2016-01-05 MED ORDER — ACETAMINOPHEN 325 MG PO TABS: 650.0000 mg | ORAL_TABLET | ORAL | Status: DC | PRN

## 2016-01-05 MED ORDER — METOPROLOL TARTRATE 5 MG/5ML IV SOLN
2.0000 mg | INTRAVENOUS | Status: DC | PRN
  Filled 2016-01-05: qty 5

## 2016-01-05 MED ORDER — PROTAMINE SULFATE 10 MG/ML IV SOLN
INTRAVENOUS | Status: DC | PRN
  Administered 2016-01-05: 40 mg via INTRAVENOUS

## 2016-01-05 MED ORDER — ROSUVASTATIN CALCIUM 20 MG PO TABS
20.0000 mg | ORAL_TABLET | Freq: Every day | ORAL | Status: DC
  Administered 2016-01-05 – 2016-01-06 (×2): 20 mg via ORAL
  Filled 2016-01-05 (×2): qty 1

## 2016-01-05 SURGICAL SUPPLY — 74 items
BAG DECANTER FOR FLEXI CONT (MISCELLANEOUS) IMPLANT
BALLN CODA OCL 2-9.0-35-120-3 (BALLOONS)
BALLOON COD OCL 2-9.0-35-120-3 (BALLOONS) IMPLANT
CANISTER SUCTION 2500CC (MISCELLANEOUS) ×3 IMPLANT
CATH ANGIO 5F BER2 65CM (CATHETERS) ×3 IMPLANT
CATH FELDMAN FA1 6F 100CM (CATHETERS) ×3 IMPLANT
CATH HEADHUNTER H1 5F 100CM (CATHETERS) ×3 IMPLANT
CATH OMNI FLUSH .035X70CM (CATHETERS) ×3 IMPLANT
CLIP TI MEDIUM 24 (CLIP) IMPLANT
CLIP TI WIDE RED SMALL 24 (CLIP) IMPLANT
COVER MAYO STAND STRL (DRAPES) ×3 IMPLANT
DERMABOND ADVANCED (GAUZE/BANDAGES/DRESSINGS) ×4
DERMABOND ADVANCED .7 DNX12 (GAUZE/BANDAGES/DRESSINGS) ×2 IMPLANT
DEVICE CLOSURE PERCLS PRGLD 6F (VASCULAR PRODUCTS) ×4 IMPLANT
DEVICE TORQUE KENDALL .025-038 (MISCELLANEOUS) ×3 IMPLANT
DRSG TEGADERM 2-3/8X2-3/4 SM (GAUZE/BANDAGES/DRESSINGS) ×6 IMPLANT
DRYSEAL FLEXSHEATH 12FR 33CM (SHEATH) ×2
DRYSEAL FLEXSHEATH 16FR 33CM (SHEATH) ×2
ELECT CAUTERY BLADE 6.4 (BLADE) ×3 IMPLANT
ELECT REM PT RETURN 9FT ADLT (ELECTROSURGICAL) ×6
ELECTRODE REM PT RTRN 9FT ADLT (ELECTROSURGICAL) ×2 IMPLANT
EXCLUDER TNK LEG 26MX14X14 (Endovascular Graft) ×1 IMPLANT
EXCLUDER TRUNK LEG 26MX14X14 (Endovascular Graft) ×3 IMPLANT
GAUZE SPONGE 2X2 8PLY STRL LF (GAUZE/BANDAGES/DRESSINGS) ×2 IMPLANT
GLOVE BIO SURGEON STRL SZ 6.5 (GLOVE) ×4 IMPLANT
GLOVE BIO SURGEON STRL SZ7 (GLOVE) ×3 IMPLANT
GLOVE BIO SURGEONS STRL SZ 6.5 (GLOVE) ×2
GLOVE BIOGEL PI IND STRL 6.5 (GLOVE) ×4 IMPLANT
GLOVE BIOGEL PI IND STRL 7.5 (GLOVE) ×1 IMPLANT
GLOVE BIOGEL PI INDICATOR 6.5 (GLOVE) ×8
GLOVE BIOGEL PI INDICATOR 7.5 (GLOVE) ×2
GLOVE ECLIPSE 7.5 STRL STRAW (GLOVE) ×6 IMPLANT
GOWN STRL REUS W/ TWL LRG LVL3 (GOWN DISPOSABLE) ×3 IMPLANT
GOWN STRL REUS W/ TWL XL LVL3 (GOWN DISPOSABLE) ×2 IMPLANT
GOWN STRL REUS W/TWL LRG LVL3 (GOWN DISPOSABLE) ×6
GOWN STRL REUS W/TWL XL LVL3 (GOWN DISPOSABLE) ×4
GRAFT BALLN CATH 65CM (STENTS) ×2 IMPLANT
GUIDEWIRE ANGLED .035X150CM (WIRE) ×3 IMPLANT
GUIDEWIRE ANGLED .035X260CM (WIRE) ×3 IMPLANT
KIT BASIN OR (CUSTOM PROCEDURE TRAY) ×3 IMPLANT
KIT ROOM TURNOVER OR (KITS) ×3 IMPLANT
LEG CONTRALATERAL 16X16X13.5 (Endovascular Graft) ×2 IMPLANT
LEG CONTRALATERAL 16X18X13.5 (Endovascular Graft) ×3 IMPLANT
LEG CONTRALATERAL 27X12 (Vascular Products) ×3 IMPLANT
NEEDLE PERC 18GX7CM (NEEDLE) ×3 IMPLANT
NS IRRIG 1000ML POUR BTL (IV SOLUTION) ×6 IMPLANT
PACK ENDOVASCULAR (PACKS) ×3 IMPLANT
PAD ARMBOARD 7.5X6 YLW CONV (MISCELLANEOUS) ×6 IMPLANT
PENCIL BUTTON HOLSTER BLD 10FT (ELECTRODE) ×3 IMPLANT
PERCLOSE PROGLIDE 6F (VASCULAR PRODUCTS) ×12
SHEATH AVANTI 11CM 8FR (MISCELLANEOUS) ×3 IMPLANT
SHEATH BRITE TIP 8FR 23CM (MISCELLANEOUS) ×3 IMPLANT
SHEATH DRYSEAL FLEX 12FR 33CM (SHEATH) ×1 IMPLANT
SHEATH DRYSEAL FLEX 16FR 33CM (SHEATH) ×1 IMPLANT
SPONGE GAUZE 2X2 STER 10/PKG (GAUZE/BANDAGES/DRESSINGS) ×4
STAPLER VISISTAT 35W (STAPLE) IMPLANT
STENT GRAFT BALLN CATH 65CM (STENTS) ×4
STENT GRAFT CONTRALAT 16X13.5 (Endovascular Graft) ×1 IMPLANT
STOPCOCK MORSE 400PSI 3WAY (MISCELLANEOUS) ×3 IMPLANT
SUT ETHILON 3 0 PS 1 (SUTURE) IMPLANT
SUT MNCRL AB 4-0 PS2 18 (SUTURE) ×6 IMPLANT
SUT PROLENE 5 0 C 1 24 (SUTURE) IMPLANT
SUT VIC AB 2-0 CTX 36 (SUTURE) IMPLANT
SUT VIC AB 3-0 SH 27 (SUTURE) ×4
SUT VIC AB 3-0 SH 27X BRD (SUTURE) ×2 IMPLANT
SYR 20CC LL (SYRINGE) IMPLANT
SYR 30ML LL (SYRINGE) IMPLANT
SYR MEDRAD MARK V 150ML (SYRINGE) IMPLANT
SYRINGE 10CC LL (SYRINGE) IMPLANT
TRAY FOLEY W/METER SILVER 16FR (SET/KITS/TRAYS/PACK) ×3 IMPLANT
TUBING HIGH PRESSURE 120CM (CONNECTOR) ×6 IMPLANT
WATER STERILE IRR 1000ML POUR (IV SOLUTION) ×3 IMPLANT
WIRE AMPLATZ SS-J .035X180CM (WIRE) ×6 IMPLANT
WIRE BENTSON .035X145CM (WIRE) ×6 IMPLANT

## 2016-01-05 NOTE — Anesthesia Preprocedure Evaluation (Addendum)
Anesthesia Evaluation  Patient identified by MRN, date of birth, ID band Patient awake    Reviewed: Allergy & Precautions, NPO status , Patient's Chart, lab work & pertinent test results, reviewed documented beta blocker date and time   History of Anesthesia Complications Negative for: history of anesthetic complications  Airway Mallampati: II  TM Distance: >3 FB Neck ROM: Full    Dental  (+) Dental Advisory Given   Pulmonary shortness of breath and Long-Term Oxygen Therapy, COPD,  oxygen dependent, former smoker,    breath sounds clear to auscultation       Cardiovascular hypertension, Pt. on medications and Pt. on home beta blockers + CAD, + Peripheral Vascular Disease and + DOE   Rhythm:Regular     Neuro/Psych negative neurological ROS  negative psych ROS   GI/Hepatic negative GI ROS, Neg liver ROS,   Endo/Other    Renal/GU CRFRenal disease     Musculoskeletal  (+) Arthritis ,   Abdominal   Peds  Hematology   Anesthesia Other Findings   Reproductive/Obstetrics                            Anesthesia Physical Anesthesia Plan  ASA: III  Anesthesia Plan: Spinal   Post-op Pain Management:    Induction:   Airway Management Planned: Natural Airway, Nasal Cannula and Simple Face Mask  Additional Equipment: None  Intra-op Plan:   Post-operative Plan:   Informed Consent: I have reviewed the patients History and Physical, chart, labs and discussed the procedure including the risks, benefits and alternatives for the proposed anesthesia with the patient or authorized representative who has indicated his/her understanding and acceptance.   Dental advisory given  Plan Discussed with: CRNA and Surgeon  Anesthesia Plan Comments:        Anesthesia Quick Evaluation

## 2016-01-05 NOTE — Op Note (Addendum)
OPERATIVE NOTE   PROCEDURE: 1. Bilateral common femoral artery cannulation under ultrasound guidance 2. "Preclose" repair of bilateral common femoral artery  3. Placement of catheter in aorta x 2 4. Aortogram 5. Placement of bifurcated aortic endograft (26mm x 14 mm x 14cm)  7. Placement of right iliac limb (16mm x 13.5cm) 8. Placement of right iliac limb extension (18mm x 13.5cm) 9. Placement of left iliac limb extension (27 mm x 12 cm) 10. Radiologic S&I  PRE-OPERATIVE DIAGNOSIS: asymptomatic large abdominal aortic aneurysm >8 cm  POST-OPERATIVE DIAGNOSIS: same as above   SURGEON: Leonides SakeBrian Chen, MD  ASSISTANT: Doreatha MassedSamantha Rhyne, PAC   ANESTHESIA: spinal, MAC  ESTIMATED BLOOD LOSS: 50 cc  CONTRAST: 94 cc   FINDING(S): 1. Successful exclusion of abdominal aortic aneurysm  2.  Persistent renal and superior mesenteric artery perfusion 3.  Persistent bilateral internal iliac artery perfusion 4.  No endoleak 5.  Dopperable bilateral anterior tibial artery signals at end of case  SPECIMEN(S): none  INDICATIONS:  Kenneth PyoDavid G Garay Sr. is a 81 y.o. (05/17/1934) male who presents with large asymptomatic abdominal aortic aneurysm >8.0 cm. This patient had extensive preoperative cardiopulmonary evaluation due multiple active co-morbidities.  His was found to be at high risk from a cardiopulmonary viewpoint.  The patient was aware he was at high risk for mortality with a AAA >8.0 cm, annual mortality >50%.  He was also aware he was at higher risk for mortality for EVAR given his multiple co-morbidities.  I estimated >20% mortality risk.  As he has elected to pursue repair in the event of a rupture, I felt that proceeding with an elective high risk EVAR had better odds than his natural disease process.  The patient is aware the risks of endovascular aortic surgery include but are not limited to: bleeding, need for transfusion, infection, death, stroke, paralysis, wound  complications, spinal, pelvic and bowel ischemia, extended ventilation, anaphylactic reaction to contrast, contrast induced nephropathy, embolism, and need for additional procedure to address endoleaks.  He elected to proceed with the procedure.  DESCRIPTION: After obtaining full informed written consent, the patient was brought back to the operating room and placed supine upon the operating table. The patient received IV antibiotics prior to induction. After obtaining adequate spinal anesthesia, the patient was prepped and draped in the standard fashion for: open and endovascular aortic repair.  At this point, I turned my attention to the right groin. Under Sonosite guidance, I cannulated the right common femoral artery with a 18 gauge needle. A Bentson wire was loaded into the distal aorta under fluoroscopic guidance.  I dilated the subcutaneous tissue tract with a 8-Fr dilator.  I then loaded a Proglide device over the wire.  I verified the position of the Proglide under fluroscopic guidance.  I deployed this device at a 30 degree medial angle.  The Proglide sutures were removed and tagged.  I pulled back the used Proglide device and reloaded the Bentson wire.  This used device was exchanged for a new Proglide device.  The new device was deployed similarly at a 30 degree lateral angle.  The device was pulled back and then I reloaded the Bentson wire.  A long 8-Fr sheath was loaded on this right side.  In this fashion, the "preclose" technique was completed on the right common femoral artery.  A BER-2 catheter was loaded over the wire and used to steer the wire through the abdominal aortic aneurysm into the suprarenal aorta.  This was more  difficult than expected given the iliac tortuousity and neck angulation.  Once I was in the descending thoracic aorta, I exchanged the wire for an Amplatz wire.  The catheter was removed.  At this point, I turned my attention to the left groin.  I completed the  "preclose" technique similarly on the left common femoral artery.  I loaded a short 8-Fr sheath into the left common femoral artery.  I placed a BER-2 catheter over this wire and steered into the suprarenal aorta.  I exchanged the wire for an Amplatz wire.  The patient was given 8000 units of Heparin intravenously, which was a therapeutic bolus.   The left sheath was exchanged for a 16-Fr Dryseal sheath which was advanced into the distal aorta.    I then exchanged the right sheath for a 12-Fr sheath which was advanced into the infrarenal aorta.    I loaded a pigtail catheter through the right sheath into the suprarenal position.  I then loaded the main body through the left sheath: 26mm x 14 mm x 14cm in the infrarenal position. A power injector aortogram was completed to identify the location of the left renal artery which was the lowest renal artery. I deployed the main body main body in the immediate infrarenal position. The device appeared to drop, so I reconstrained the main body and pushed it back up.  This time when I released the device which pushing slightly proximally, the left side of the graft deployed exactly distal to the left renal.  On completion, aortogram, the left renal artery appeared patent with good immediate infrarenal positioning of the endograft.    At this point, I replaced the Amplatz wire in the pigtail catheter and removed the catheter. I then placed a buddy wire, Bentson wire through the right femoral sheath with a cheater. A C-1 catheter was loaded and used with a Glidewire to try to cannulate the contralateral gate.  Unfortunately, there was not enough steerability to get into the gate.  This catheter was exchanged for a double curved catheter which allowed me to eventually get into the contralateral gate.  The wire was advanced into the suprarenal aorta, but the catheter would not track, so I exchanged it for a BER-2 catheter which was placed into the suprarenal aorta.   The wire was exchanged for the Amplatz wire.  The catheter was exchanged for a pigtail catheter. The wire was pulled down and the pigtail catheter was rotated at the top of the endograft to demonstrated position of the catheter within the main body. The Amplatz wire was replaced in this catheter and the catheter pulled down to the flow divider.   A retrograde LAO injection from the right femoral sheath demonstrated the position of the right internal iliac artery.  There was significantly longer length in the iliac arterial system, so I planned on using a bridging piece.  Based on the length measurements, a 16mm x 13.5cm right iliac limb was selected. I positioned this limb extension with adequate overlap and deployed the limb. The stent delivery device was removed. I then deployed the bell-bottom iliac limb, 18 mm x 13.5, proximal to the right internal iliac artery.  The stent delivery device was removed.    At this point, I performed a retrograde injection on the left side to confirm of the left internal iliac artery position. Based on the injection, it appeared that extending the left iliac (ipsilateral) limb was going to be necessary.  I first tried to released  the ipsilateral limb, but it appeared to be stuck.  After manipulating the device by releasing and reconstraining the device, the ipsilateral limb deployed and I was able to release the main body.  I removed the stent delivery device.  At this point, I passed the Q50 balloon up the right sheath. I inflated the molding balloon at the proximal aortic endograft, at the flow divider, overlapping segments, and at the end of the right iliac limb. The balloon was deflated and placed on the left wire. I positioned this balloon on the overlapping segment and the distal extent of the ipsilateral limb.  This was inflated to profile to mold the ipslateral limb and extension.    A pigtail catheter was loaded on the right side. A completion  aortogram was completed. Based on the images, there appeared to be a small delayed Type 2 endoleak adjacent to the aortic neck.  I replaced the Q50 balloon on the left wire and remolded the main body proximally.  On completion aortogram, all endoleaks were gone.  The completion findings are as listed above.  I replaced a Bentson wire through the right pigtail catheter.  A BER-2 catheter was loaded over the left wire to exchange the wire for another Bentson wire.  I gave the patient 40 mg of Protamine to reverse anticoagulation.    At this point, the left sheath was removed while pressure was held proximally and distally.  The Proglide sutures were sequentially tightened with the wire still in the artery.  I felt there was adequate hemostasis so the wire was removed.  The Proglide sutures were again sequentially tightened.  The white locking stitch was pulled on each set of sutures.  I placed these sutures under tension with a hemostat.   This completed the "preclose" repair of the right common femoral artery.  I repeated this same process on the left common femoral artery, completing the "preclose" repair of the left common femoral artery.  Each groin incision with repaired with a U-stitch of 4-0 Monocryl. The skin in each incision was cleaned, dried, and reinforced with Dermabond.  Distally bilateral anterior tibial artery arteries were dopplerable.   COMPLICATIONS: none  CONDITION: stable   Leonides SakeBrian Chen, MD, [facility] [place] Office: [phone] Pager: [phone]  01/05/2016, 10:08 AM

## 2016-01-05 NOTE — Interval H&P Note (Signed)
[facility] [place]  History and Physical Update  The patient was interviewed and re-examined.  The patient's previous History and Physical has been reviewed and is unchanged from my consult except for: interval Cardiology, Pulmonary, and Cardiothoracic Surgery evaluations.  This patient is high risk from a cardiopulmonary viewpoint with no cardiac revascularization options.  Given his large AAA >8 cm, this patient is at >50% risk of rupture and death within the year.  The patient is NOT interested in expectant management of his AAA, subsequent, I have offered him an EVAR.   The patient is aware the risks of endovascular aortic surgery include but are not limited to: bleeding, need for transfusion, infection, death, stroke, paralysis, wound complications, spinal, pelvic and bowel ischemia, extended ventilation, anaphylactic reaction to contrast, contrast induced nephropathy, embolism, and need for additional procedure to address endoleaks.    Overall, due to this patient's significant co-morbidities, I would expect mortality risks to exceed 20%, which is still better than the >50% expected from his large AAA.   Leonides SakeBrian Johnica Armwood, MD [facility] [place] Office: [phone] Pager: [phone]  01/05/2016, 7:04 AM

## 2016-01-05 NOTE — H&P (View-Only) (Signed)
Referred by: Dr. Eliott NineErin Schlossmann [facility] ED)  Reason for referral: possible contained ruptured AAA  History of Present Illness  The patient is a 81 y.o. (03/17/1934) male who presents with chief complaint: back pain.  This patient had recurrence of his previous back pain associated with DDD with radicular sx in his leg similar to previous episodes.  The patient had a MRI completed at an outside facility which was read as possible contained rupture of AAA.  Pt currently denies back or abdominal pain.  The patient notes no history of embolic episodes from the AAA.  The patient's risk factors for AAA included: age and sex and prior smoker.  The patient does not smoke cigarettes.  Past Medical History:  Diagnosis Date  . Arteriosclerotic cardiovascular disease   . Chronic kidney disease   . Chronic kidney disease, stage IV (severe) (HCC)   . DJD (degenerative joint disease)   . Hypertension   . Hypertensive renal disease   . Renal tubular acidosis   . Secondary hyperparathyroidism [facility](HCC)     Past Surgical History:  Procedure Laterality Date  . APPENDECTOMY    . AV FISTULA PLACEMENT Right 06/28/2015   Procedure: RIGHT BRACHIOCEPHALIC ARTERIOVENOUS (AV) FISTULA CREATION;  Surgeon: Fransisco HertzBrian L Ori Trejos, MD;  Location[facility] OR;  Service: Vascular;  Laterality: Right;  . HIP FRACTURE SURGERY Left   . MULTIPLE TOOTH EXTRACTIONS    . TONSILLECTOMY      Social History   Social History  . Marital status: Married    Spouse name: N/A  . Number of children: N/A  . Years of education: N/A   Occupational History  . Not on file.   Social History Main Topics  . Smoking status: Former Smoker    Types: Cigarettes  . Smokeless tobacco: Current User    Types: Chew     Comment: " years ago i quit smoking cigarettes"  . Alcohol use No  . Drug use: No  . Sexual activity: Not on file   Other Topics Concern  . Not on file   Social History Narrative  . No narrative on file    Family History    Problem Relation Age of Onset  . Diabetes Mother   . Stroke Father   . AAA (abdominal aortic aneurysm) Father     No current facility-administered medications for this encounter.    Current Outpatient Prescriptions  Medication Sig Dispense Refill  . allopurinol (ZYLOPRIM) 300 MG tablet Take 300 mg by mouth daily.    Marland Kitchen. aspirin EC 81 MG tablet Take 81 mg by mouth daily.    . B Complex-C-Folic Acid (RENA-VITE RX) 1 MG TABS Take 1 mg by mouth daily.    . calcium acetate (PHOSLO) 667 MG capsule Take 667 mg by mouth 2 (two) times daily with a meal.    . fluticasone (FLONASE) 50 MCG/ACT nasal spray Place 1 spray into both nostrils daily as needed.    . loratadine (CLARITIN) 10 MG tablet Take 10 mg by mouth at bedtime.    . metoprolol succinate (TOPROL-XL) 50 MG 24 hr tablet Take 50 mg by mouth at bedtime. Take with or immediately following a meal.    . rosuvastatin (CRESTOR) 20 MG tablet Take 20 mg by mouth daily.    . sodium bicarbonate 650 MG tablet Take 1,300 mg by mouth 2 (two) times daily.    . VENTOLIN HFA 108 (90 Base) MCG/ACT inhaler Inhale 1 puff into the lungs every 4 (four)  hours as needed for wheezing.    Marland Kitchen oxyCODONE-acetaminophen (ROXICET) 5-325 MG tablet Take 1-2 tablets by mouth every 6 (six) hours as needed for moderate pain. (Patient not taking: Reported on 12/23/2015) 30 tablet 0     Allergies  Allergen Reactions  . Phenobarbital Hives and Itching  . Prednisone Rash     Review of Systems (Positive items checked otherwise negative)  General: [ ]  Weight loss, [ ]  Weight gain, [ ]   Loss of appetite, [ ]  Fever  Neurologic: [ ]  Dizziness, [ ]  Blackouts, [ ]  Headaches, [ ]  Seizure  Ear/Nose/Throat: [ ]  Change in eyesight, [ ]  Change in hearing, [ ]  Nose bleeds, [ ]  Sore throat  Vascular: [ ]  Pain in legs with walking, [ ]  Pain in feet while lying flat, [ ]  Non-healing ulcer, Stroke, [ ]  "Mini stroke", [ ]  Slurred speech, [ ]  Temporary blindness, [ ]  Blood clot in vein, [  ] Phlebitis  Pulmonary: [ ]  Home oxygen, [ ]  Productive cough, [ ]  Bronchitis, [ ]  Coughing up blood, [ ]  Asthma, [ ]  Wheezing  Musculoskeletal: [ ]  Arthritis, [ ]  Joint pain, [ ]  Muscle pain, [x]  chronic back pain  Cardiac: [ ]  Chest pain, [ ]  Chest tightness/pressure, [ ]  Shortness of breath when lying flat, [ ]  Shortness of breath with exertion, [ ]  Palpitations, [ ]  Heart murmur, [ ]  Arrythmia,  [ ]  Atrial fibrillation  Hematologic: [ ]  Bleeding problems, [ ]  Clotting disorder, [ ]  Anemia  Psychiatric:  [ ]  Depression, [ ]  Anxiety, [ ]  Attention deficit disorder  Gastrointestinal:  [ ]  Black stool,[ ]   Blood in stool, [ ]  Peptic ulcer disease, [ ]  Reflux, [ ]  Hiatal hernia, [ ]  Trouble swallowing, [ ]  Diarrhea, [ ]  Constipation  Urinary:  [ ]  Kidney disease, [ ]  Burning with urination, [ ]  Frequent urination, [ ]  Difficulty urinating, [x]  CKD stage IV  Skin: [ ]  Ulcers, [ ]  Rashes   For VQI Use Only  PRE-ADM LIVING: Home  AMB STATUS: Ambulatory  CAD Sx: None  PRIOR CHF: None  STRESS TEST: No   Physical Examination  Vitals:   12/23/15 1530 12/23/15 1630 12/23/15 1645 12/23/15 1700  BP: 136/92 128/82 139/83 145/82  Pulse: 77  79 72  Resp: 16 14 18 22   Temp:      TempSrc:      SpO2: 93%  92% 91%  Weight:      Height:        Body mass index is 26.15 kg/m.  General: Alert, O x 3, WD,NAD  Head: [place]/AT,   Ear/Nose/Throat: Hearing grossly intact, nares without erythema or drainage, oropharynx without Erythema or Exudate , Mallampati score: 3, Dentition intact  Eyes: PERRLA, EOMI,   Neck: Supple, mid-line trachea,    Pulmonary: Sym exp, good B air movt,CTA B  Cardiac: RRR, Nl S1, S2, no Murmurs, No rubs, No S3,S4  Vascular: Vessel Right Left  Radial Faintly palpable Palpable  Brachial Palpable Palpable  Carotid Palpable, No Bruit Palpable, No Bruit  Aorta Not palpable N/A  Femoral Palpable Palpable  Popliteal Not palpable Not palpable  PT Palpable  Palpable  DP Palpable Palpable   Gastrointestinal: soft, non-distended, non-tender to palpation, No guarding or rebound, no HSM, faintly palpable AAA in RUQ and epigastrium, no CVAT B,    Musculoskeletal: M/S 5/5 throughout  , Extremities without ischemic changes  , No edema present, No LDS present, palpable thrill in R upper arm strong  thrill  Neurologic: CN 2-12 intact , Pain and light touch intact in extremities , Motor exam as listed above  Psychiatric: Judgement intact, Mood & affect appropriate for pt's clinical situation  Dermatologic: See M/S exam for extremity exam, No rashes otherwise noted  Lymph : Palpable lymph nodes: None   Laboratory: CBC:    Component Value Date/Time   WBC 10.6 (H) 12/23/2015 1445   RBC 5.57 12/23/2015 1445   HGB 18.8 (H) 12/23/2015 1445   HCT 54.8 (H) 12/23/2015 1445   PLT 161 12/23/2015 1445   MCV 98.4 12/23/2015 1445   MCH 33.8 12/23/2015 1445   MCHC 34.3 12/23/2015 1445   RDW 15.2 12/23/2015 1445   LYMPHSABS 1.9 12/23/2015 1445   MONOABS 0.8 12/23/2015 1445   EOSABS 0.3 12/23/2015 1445   BASOSABS 0.1 12/23/2015 1445    BMP:    Component Value Date/Time   NA 138 12/23/2015 1645   K 4.2 12/23/2015 1645   CL 112 (H) 12/23/2015 1645   CO2 20 (L) 12/23/2015 1645   GLUCOSE 92 12/23/2015 1645   BUN 25 (H) 12/23/2015 1645   CREATININE 2.09 (H) 12/23/2015 1645   CALCIUM 9.6 12/23/2015 1645   GFRNONAA 28 (L) 12/23/2015 1645   GFRAA 33 (L) 12/23/2015 1645    Coagulation: Lab Results  Component Value Date   INR 1.03 12/23/2015   No results found for: PTT  Lipids: No results found for: CHOL, TRIG, HDL, CHOLHDL, VLDL, LDLCALC, LDLDIRECT  Radiology: Ct Angio Chest/abd/pel For Dissection W And/or W/wo  Result Date: 12/23/2015 CLINICAL DATA:  Abdominal aortic aneurysm with worsening back pain. EXAM: CT ANGIOGRAPHY CHEST, ABDOMEN AND PELVIS TECHNIQUE: Multidetector CT imaging through the chest, abdomen and pelvis was performed using  the standard protocol during bolus administration of intravenous contrast. Multiplanar reconstructed images and MIPs were obtained and reviewed to evaluate the vascular anatomy. CONTRAST:  100 cc of Isovue 370 IV COMPARISON:  MRI from 12/23/2015, 08/29/2007 ultrasound in FINDINGS: CTA CHEST FINDINGS Cardiovascular: Satisfactory opacification of the pulmonary arteries to the segmental level. No evidence of pulmonary embolism. The heart is enlarged. No pericardial effusion. There is three-vessel coronary arteriosclerosis. No thoracic aortic aneurysm nor dissection. Mediastinum/Nodes: No enlarged mediastinal, hilar, or axillary lymph nodes. Thyroid gland, trachea, and esophagus demonstrate no significant findings. Lungs/Pleura: Bilateral paraseptal and centrilobular emphysema with superimposed ground-glass opacities which may represent sequela mild pulmonary edema. Musculoskeletal: Anterior compression of T7 which appears chronic without evidence of paraspinal hematoma or retropulsion. Degenerative disc disease from T1 through T5 and T7 through T11. Review of the MIP images confirms the above findings. CTA ABDOMEN AND PELVIS FINDINGS VASCULAR Aorta: There is a fusiform infrarenal abdominal aortic aneurysm measuring up to 8.1 cm transverse. The proximal neck measures 2.2 cm in caliber and is approximately 3.5 cm from the left renal artery origin. The distal aspect of the fusiform aortic aneurysm extends to the aortic bifurcation were there is an ectatic appearance of both common iliac arteries, on the right measuring up to 1.9 cm and on the left, appearing more aneurysmal measuring 2.9 cm with mild circumferential soft plaque. The external iliac artery on the right measures 0.6 cm and on the left 0.8 cm and are patent. No evidence of abdominal aortic leak. No retroperitoneal fluid is seen. Along the left lateral soft plaque are developing calcifications which account for the hyperdensity seen. No communication with  intraluminal contrast to suggested an ulcerated plaque. There is a small thrombosed 1.5 cm aneurysm of what appears to  be a lumbar artery branch, series 501 image 170. Celiac: Patent without evidence of aneurysm, dissection, vasculitis or significant stenosis. Atherosclerosis is noted at the origin. SMA: Atherosclerosis at the origin of the SMA. No dissection or aneurysm. Renals: Single renal arteries to both kidneys, attenuated on the right measuring 4 mm in caliber and 9 mm on the left. Small webs are noted in the proximal renal arteries. IMA: Patent without evidence of aneurysm, dissection, vasculitis or significant stenosis. Inflow: Patent without evidence of aneurysm, dissection, vasculitis or significant stenosis.Atherosclerosis of the descending thoracic aorta without aneurysm. Veins: No obvious venous abnormality within the limitations of this arterial phase study. Review of the MIP images confirms the above findings. NON-VASCULAR Hepatobiliary: There are too numerous to count hypodensities within the liver statistically consistent with cysts and/or hemangiomata. There is a granuloma in the right hepatic lobe. The gallbladder is not distended but contains several calculi. No wall thickening is noted. Pancreas: Fatty atrophy of the pancreas without ductal dilatation or focal mass. Spleen: The spleen is not enlarged. Adrenals/Urinary Tract: Normal bilateral adrenal glands. Right worse than left renal cortical atrophy with bilateral simple and complex renal cysts. Extrarenal pelves are seen bilaterally. Urinary bladder is distended without acute abnormalities. Stomach/Bowel: Normal bowel rotation. No acute inflammation. There is colonic diverticulosis without acute diverticulitis. Lymphatic: No lymphadenopathy. Reproductive: Prostate appears unremarkable. Other: No ascites or pneumoperitoneum. Musculoskeletal: No acute osseous abnormality.  Lumbar spondylosis. Review of the MIP images confirms the above  findings. IMPRESSION: 1. Infrarenal fusiform abdominal aortic aneurysm measuring 8 cm transverse without evidence of acute intramural hematoma or aortic leak. Developing calcifications seen within the soft plaque along the left lateral aspect of this aneurysm. The aneurysm is 3.5 cm from the renal arteries it measures approximately 2.2 cm in caliber. The aneurysm terminates at the aortic bifurcation. There is ectasia of the right common iliac artery to 1.9 cm in and more aneurysmal dilatation of the left common iliac artery to 2.9 cm with plaque noted. The external iliac arteries are patent measuring 0.6 cm on the right and 0.8 cm on the left. 2. Numerous hepatic and bilateral renal hypodensities consistent with cysts. Atrophic appearing kidneys bilaterally. 3. No pulmonary embolus or thoracic aortic aneurysm. No aortic dissection. All 4.  Pulmonary emphysema. 5. No acute thoracic abnormality. Chronic appearing T7 compression. Critical Value/emergent results were called by telephone at the time of interpretation on 12/23/2015 at 5:13 pm to Dr. Alvira Monday , who verbally acknowledged these results and earlier to Dr. [name] of Vascular Surgery. Electronically Signed   By: Tollie Eth M.D.   On: 12/23/2015 17:14   I reviewed the patient's CTA chest/abd/pelvis, he has a large infrarenal AAA that might be compatible with EVAR repair.  There is significant angulation but I suspect there is enough to neck to place an endograft with possible need for a cuff due to the angulation.  There appears to be small B CIA aneurysm also that would be repaired at the same time.   Medical Decision Making  The patient is a 81 y.o. male who presents with: chronic kidney disease stage IV s/p R BC AVF, large asx AAA, chronically sx degenerative disc disease (DDD)   This pt has sx DDD that resulted in incidentally found large AAA.  There is no evidence of rupture on the CTA despite the MRI findings.  There is NO  indicate for emergent repair of this large AAA.  Due to his CKD IV and contrast load needed  for the CTA, I recommend admission to Hospitalist service for IV hydration and observation for possible contrast induced nephropathy leading to ARF.  While admitted, would get Cardiac consultation to expedite preop evaluation.  Will get the CTA reviewed with my [employer] rep on Monday as I suspect his anatomy is outside of the IFU for the C3 device.  I still suspect it can be repaired with such.  Would subsequently plan on proceeding with EVAR the week of 4th of Dec.  Assuming preop Cardiology evaluation completed, to facilitate preop Anes evaluation.  Will continue to follow with you.  Thank you for allowing us to participate in this patient's care.   Leonides SakeBrian Valeri Sula, MD [facility] [place] Office: [phone] Pager: [phone]  12/23/2015, 5:17 PM

## 2016-01-05 NOTE — Anesthesia Postprocedure Evaluation (Signed)
Anesthesia Post Note  Patient: Kenneth PyoDavid G Casto Sr.  Procedure(s) Performed: Procedure(s) (LRB): ABDOMINAL AORTIC ENDOVASCULAR STENT GRAFT INSERTION (N/A)  Patient location during evaluation: PACU Anesthesia Type: Spinal Level of consciousness: awake Pain management: pain level controlled Vital Signs Assessment: post-procedure vital signs reviewed and stable Respiratory status: spontaneous breathing Cardiovascular status: stable Postop Assessment: no signs of nausea or vomiting and spinal receding Anesthetic complications: no    Last Vitals:  Vitals:   01/05/16 1800 01/05/16 1900  BP: 105/80 114/72  Pulse: 68 64  Resp: 15 18  Temp:      Last Pain:  Vitals:   01/05/16 1500  TempSrc: Oral  PainSc: 0-No pain                 Jendaya Gossett

## 2016-01-05 NOTE — Anesthesia Procedure Notes (Signed)
Procedure Name: MAC Date/Time: 01/05/2016 7:35 AM Performed by: Charm BargesBUTLER, Bonner R Pre-anesthesia Checklist: Patient identified, Emergency Drugs available, Suction available, Patient being monitored and Timeout performed Patient Re-evaluated:Patient Re-evaluated prior to inductionOxygen Delivery Method: Nasal cannula Placement Confirmation: positive ETCO2 Dental Injury: Teeth and Oropharynx as per pre-operative assessment

## 2016-01-05 NOTE — Transfer of Care (Signed)
Immediate Anesthesia Transfer of Care Note  Patient: Kenneth PyoDavid G Naples Sr.  Procedure(s) Performed: Procedure(s): ABDOMINAL AORTIC ENDOVASCULAR STENT GRAFT INSERTION (N/A)  Patient Location: PACU  Anesthesia Type:Spinal  Level of Consciousness: alert , oriented and patient cooperative  Airway & Oxygen Therapy: Patient Spontanous Breathing and Patient connected to nasal cannula oxygen  Post-op Assessment: Report given to RN and Post -op Vital signs reviewed and stable  Post vital signs: Reviewed and stable  Last Vitals:  Vitals:   01/05/16 0606  BP: (!) 158/92  Pulse: 64  Resp: 18  Temp: 36.5 C    Last Pain:  Vitals:   01/05/16 0606  TempSrc: Oral      Patients Stated Pain Goal: 2 (01/05/16 [id number])  Complications: No apparent anesthesia complications

## 2016-01-05 NOTE — Anesthesia Procedure Notes (Signed)
Spinal  Patient location during procedure: OR Start time: 01/05/2016 7:40 AM End time: 01/05/2016 7:50 AM Staffing Anesthesiologist: Val EagleMOSER, Margarett Viti Preanesthetic Checklist Completed: patient identified, surgical consent, pre-op evaluation, timeout performed, IV checked, risks and benefits discussed and monitors and equipment checked Spinal Block Patient position: sitting Prep: site prepped and draped and DuraPrep Patient monitoring: heart rate, cardiac monitor, continuous pulse ox and blood pressure Approach: midline Location: L3-4 Injection technique: single-shot Needle Needle type: Pencan  Needle gauge: 24 G Needle length: 10 cm Assessment Sensory level: T8

## 2016-01-05 NOTE — Progress Notes (Signed)
  Day of Surgery Note    Subjective:  Ready for something to drink  Vitals:   01/05/16 1030 01/05/16 1045  BP:    Pulse: (!) 56 62  Resp: 12 18  Temp:      Incisions:   Bilateral groins are soft without hematoma Extremities:  + doppler signals right DP/PT and left DP Cardiac:  regular Lungs:  Non labored Abdomen:   Soft, NT/ND   Assessment/Plan:  This is a 81 y.o. male who is s/p  1. Bilateral common femoral artery cannulation under ultrasound guidance 2. "Preclose" repair of bilateral common femoral artery  3. Placement of catheter in aorta x 2 4. Aortogram 5. Placement of bifurcated aortic endograft (26mm x 14 mm x 14cm)  7. Placement of right iliac limb (16mm x 13.5cm) 8. Placement of right iliac limb extension (18mm x 13.5cm) 9. Placement of left iliac limb extension (27 mm x 12 cm) 10. Radiologic S&I   -pt doing well in pacu -he has +doppler signals in his feet -IVF at 50cc/hr given his renal function -to 2 south when bed available as there are no [facility]   Ayiden Milliman, PA-C 01/05/2016 11:03 AM [phone]

## 2016-01-06 ENCOUNTER — Encounter (HOSPITAL_COMMUNITY): Payer: Self-pay | Admitting: Vascular Surgery

## 2016-01-06 DIAGNOSIS — L8990 Pressure ulcer of unspecified site, unspecified stage: Secondary | ICD-10-CM | POA: Insufficient documentation

## 2016-01-06 LAB — BASIC METABOLIC PANEL
ANION GAP: 7 (ref 5–15)
BUN: 24 mg/dL — AB (ref 6–20)
CALCIUM: 8.5 mg/dL — AB (ref 8.9–10.3)
CO2: 21 mmol/L — ABNORMAL LOW (ref 22–32)
Chloride: 109 mmol/L (ref 101–111)
Creatinine, Ser: 2.01 mg/dL — ABNORMAL HIGH (ref 0.61–1.24)
GFR calc Af Amer: 35 mL/min — ABNORMAL LOW (ref >60)
GFR, EST NON AFRICAN AMERICAN: 30 mL/min — AB (ref >60)
GLUCOSE: 113 mg/dL — AB (ref 65–99)
Potassium: 4.5 mmol/L (ref 3.5–5.1)
SODIUM: 137 mmol/L (ref 135–145)

## 2016-01-06 LAB — CBC
HCT: 41.2 % (ref 39.0–52.0)
Hemoglobin: 13.5 g/dL (ref 13.0–17.0)
MCH: 32.3 pg (ref 26.0–34.0)
MCHC: 32.8 g/dL (ref 30.0–36.0)
MCV: 98.6 fL (ref 78.0–100.0)
PLATELETS: 100 10*3/uL — AB (ref 150–400)
RBC: 4.18 MIL/uL — ABNORMAL LOW (ref 4.22–5.81)
RDW: 15.1 % (ref 11.5–15.5)
WBC: 8.1 10*3/uL (ref 4.0–10.5)

## 2016-01-06 NOTE — Progress Notes (Signed)
   Daily Progress Note   Assessment/Planning: POD #1 s/p EVAR   UOP ok overnight.  Cr back to baseline  Ambulation limited: unchanged from baseline  Pt on oxygen at home  A-line removed  Foley removed.  Some residual UOP probably will need some Flomax +/- foley  Palpable B femoral with dopplerable AT distally  Ok to D/C on voiding issues resolved  Subjective  - 1 Day Post-Op  No pain  Objective Vitals:   01/06/16 0800 01/06/16 0900 01/06/16 1000 01/06/16 1100  BP: 132/70 124/79 (!) 108/58 (!) 110/57  Pulse: 66 71 67 70  Resp: 20 17 18 15   Temp: 98.9 F (37.2 C)   98.6 F (37 C)  TempSrc: Oral   Oral  SpO2: 96% 94% 96% 95%  Weight:      Height:         Intake/Output Summary (Last 24 hours) at 01/06/16 1415 Last data filed at 01/06/16 1300  Gross per 24 hour  Intake             2730 ml  Output              840 ml  Net             1890 ml    PULM  BLL rales CV  RRR GI  soft, NTND VASC  B groin with palpable pulses, dopplerable B AT  Laboratory CBC    Component Value Date/Time   WBC 8.1 01/06/2016 0315   HGB 13.5 01/06/2016 0315   HCT 41.2 01/06/2016 0315   PLT 100 (L) 01/06/2016 0315    BMET    Component Value Date/Time   NA 137 01/06/2016 0315   K 4.5 01/06/2016 0315   CL 109 01/06/2016 0315   CO2 21 (L) 01/06/2016 0315   GLUCOSE 113 (H) 01/06/2016 0315   BUN 24 (H) 01/06/2016 0315   CREATININE 2.01 (H) 01/06/2016 0315   CALCIUM 8.5 (L) 01/06/2016 0315   GFRNONAA 30 (L) 01/06/2016 0315   GFRAA 35 (L) 01/06/2016 0315     Leonides SakeBrian Chen, MD, FACS [facility] [place] Office: [phone] Pager: [phone]  01/06/2016, 2:15 PM

## 2016-01-06 NOTE — Discharge Summary (Signed)
[name]   Kenneth CARROLL Sr. 11/01/1934 81 y.o. male  MRN: [id number]  Admission Date: 01/05/2016  Discharge Date: 01/06/16  Physician: Kenneth Hertz, MD  Admission Diagnosis: Abdominal aortic aneurysm I71.4   HPI:   This is a 81 y.o. male who presents with chief complaint: back pain.  This patient had recurrence of his previous back pain associated with DDD with radicular sx in his leg similar to previous episodes.  The patient had a MRI completed at an outside facility which was read as possible contained rupture of AAA.  Pt currently denies back or abdominal pain.  The patient notes no history of embolic episodes from the AAA.  The patient's risk factors for AAA included: age and sex and prior smoker.  The patient does not smoke cigarettes.  Hospital Course:  The patient was admitted to the hospital and taken to the operating room on 01/05/2016 and underwent: 1. Bilateral common femoral artery cannulation under ultrasound guidance 2. "Preclose" repair of bilateral common femoral artery  3. Placement of catheter in aorta x 2 4. Aortogram 5. Placement of bifurcated aortic endograft (26mm x 14 mm x 14cm)  7. Placement of right iliac limb (16mm x 13.5cm) 8. Placement of right iliac limb extension (18mm x 13.5cm) 9. Placement of left iliac limb extension (27 mm x 12 cm) Radiologic S&I    The pt tolerated the procedure well and was transported to the PACU in good condition.   He did have a grade I hematoma left groin in the recovery room by report from the nurse.  The pt continued to have doppler signals in bilateral feet.    By POD 1, he did have some ecchymosis of the left groin, however, the hematoma was stable.  He did have acute surgical blood loss anemia and was tolerating this.  He has baseline renal dysfunction and this remained stable postoperatively.  His foley was removed and he was able to void about 75cc.  The pt was going to receive an I&O cath,  however, the pt refused.  He is discharged home with instructions to return in 4 hours if he has not voided.     The remainder of the hospital course consisted of increasing mobilization and increasing intake of solids without difficulty.  CBC    Component Value Date/Time   WBC 8.1 01/06/2016 0315   RBC 4.18 (L) 01/06/2016 0315   HGB 13.5 01/06/2016 0315   HCT 41.2 01/06/2016 0315   PLT 100 (L) 01/06/2016 0315   MCV 98.6 01/06/2016 0315   MCH 32.3 01/06/2016 0315   MCHC 32.8 01/06/2016 0315   RDW 15.1 01/06/2016 0315   LYMPHSABS 1.5 12/28/2015 0316   MONOABS 0.6 12/28/2015 0316   EOSABS 0.6 12/28/2015 0316   BASOSABS 0.1 12/28/2015 0316    BMET    Component Value Date/Time   NA 137 01/06/2016 0315   K 4.5 01/06/2016 0315   CL 109 01/06/2016 0315   CO2 21 (L) 01/06/2016 0315   GLUCOSE 113 (H) 01/06/2016 0315   BUN 24 (H) 01/06/2016 0315   CREATININE 2.01 (H) 01/06/2016 0315   CALCIUM 8.5 (L) 01/06/2016 0315   GFRNONAA 30 (L) 01/06/2016 0315   GFRAA 35 (L) 01/06/2016 0315       Discharge Instructions    ABDOMINAL PROCEDURE/ANEURYSM REPAIR/AORTO-BIFEMORAL BYPASS:  Call MD for increased abdominal pain; cramping diarrhea; nausea/vomiting    Complete by:  As directed    Call MD for:  redness, tenderness,  or signs of infection (pain, swelling, bleeding, redness, odor or green/yellow discharge around incision site)    Complete by:  As directed    Call MD for:  severe or increased pain, loss or decreased feeling  in affected limb(s)    Complete by:  As directed    Call MD for:  temperature >100.5    Complete by:  As directed    Discharge wound care:    Complete by:  As directed    Shower daily with soap and water starting 01/07/16   Driving Restrictions    Complete by:  As directed    No driving for 2 weeks   Lifting restrictions    Complete by:  As directed    No lifting for 4 weeks   Resume previous diet    Complete by:  As directed       Discharge Diagnosis:    Abdominal aortic aneurysm I71.4  Secondary Diagnosis: Patient Active Problem List   Diagnosis Date Noted  . S/P AAA repair using bifurcation graft 01/05/2016  . Acute right-sided low back pain with sciatica   . 3-vessel CAD   . Pulmonary hypertension   . DOE (dyspnea on exertion)   . Muscle weakness (generalized)   . Abdominal aortic aneurysm (AAA) without rupture (HCC) 12/23/2015  . Stage 4 chronic kidney disease (HCC) 06/10/2015   Past Medical History:  Diagnosis Date  . Abdominal aortic aneurysm (AAA) (HCC)   . Arteriosclerotic cardiovascular disease   . Chronic kidney disease   . Chronic kidney disease, stage IV (severe) (HCC)   . Coronary artery disease   . DJD (degenerative joint disease)   . Dyspnea    on O2 2 L 24/7  . Hypertension   . Hypertensive renal disease   . Pneumonia    as an child  . Renal tubular acidosis   . Secondary hyperparathyroidism (HCC)        Medication List    TAKE these medications   acetaminophen 325 MG tablet Commonly known as:  TYLENOL Take 2 tablets (650 mg total) by mouth every 4 (four) hours as needed for headache or mild pain.   allopurinol 300 MG tablet Commonly known as:  ZYLOPRIM Take 300 mg by mouth daily.   aspirin EC 81 MG tablet Take 81 mg by mouth daily.   calcium acetate 667 MG capsule Commonly known as:  PHOSLO Take 667 mg by mouth 2 (two) times daily with a meal.   fluticasone 50 MCG/ACT nasal spray Commonly known as:  FLONASE Place 1 spray into both nostrils daily.   loratadine 10 MG tablet Commonly known as:  CLARITIN Take 10 mg by mouth at bedtime.   metoprolol succinate 50 MG 24 hr tablet Commonly known as:  TOPROL-XL Take 50 mg by mouth at bedtime.   mometasone-formoterol 100-5 MCG/ACT Aero Commonly known as:  DULERA Inhale 2 puffs into the lungs 2 (two) times daily. What changed:  when to take this   nitroGLYCERIN 0.4 MG SL tablet Commonly known as:  NITROSTAT Place 1 tablet (0.4 mg total)  under the tongue every 5 (five) minutes as needed for chest pain.   oxyCODONE 5 MG immediate release tablet Commonly known as:  ROXICODONE Take 1 tablet (5 mg total) by mouth every 6 (six) hours as needed for severe pain.   RENA-VITE RX 1 MG Tabs Take 1 mg by mouth daily.   rosuvastatin 20 MG tablet Commonly known as:  CRESTOR Take 20 mg by mouth  daily at 12 noon.   sodium bicarbonate 650 MG tablet Take 1,300 mg by mouth 2 (two) times daily.   VENTOLIN HFA 108 (90 Base) MCG/ACT inhaler Generic drug:  albuterol Inhale 1-2 puffs into the lungs every 4 (four) hours as needed for wheezing.       Prescriptions given: Roxicodone #10 No Refill  Instructions: 1.  No heavy lifting x 4 weeks 2.  No driving x 2 weeks and while taking pain medication 3.  Shower daily starting 01/07/16  Disposition: home  Patient's condition: is Good.  He is on home O2.    Follow up: 1. Dr. [name] in 4 weeks with CTA protocol   Doreatha MassedSamantha Rhyne, PA-C [facility] [phone] 01/06/2016  7:45 AM   Addendum  I agree with my PA's discharge [name].  This patient with high risk cardiopulmonary status underwent Kenneth for a large AAA >8 cm.  The Kenneth was completed with spinal anesthesia and MAC.  His post-operative course was unremarkable except for minor urinary retention that never became symptomatic.  The patient will follow up in 4 weeks with a CTA abd/pelvis.   Leonides SakeBrian Aristea Posada, MD, FACS [facility] [place] Office: [phone] Pager: [phone]  01/10/2016, 10:13 AM     - For VQI Registry use - Post-op:  Time to Extubation: was not intubated-spinal anesthesia Vasopressors Req. Post-op: No MI: No., [ ]  Troponin only, [ ]  EKG or Clinical New Arrhythmia: No CHF: No ICU Stay: 1 day in ICU (only b/c there were no stepdown beds available) Transfusion: No     If yes,  units given  Complications: Resp failure: No., [ ]  Pneumonia, [ ]  Ventilator Chg in  renal function: No., [ ]  Inc. Cr > 0.5, [ ]  Temp. Dialysis,  [ ]  Permanent dialysis Leg ischemia: No., no Surgery needed, [ ]  Yes, Surgery needed,  [ ]  Amputation Bowel ischemia: No., [ ]  Medical Rx, [ ]  Surgical Rx Wound complication: No., [ ]  Superficial separation/infection, [ ]  Return to OR Return to OR: No  Return to OR for bleeding: No Stroke: No., [ ]  Minor, [ ]  Major  Discharge medications: Statin use:  Yes If No:  ASA use:  Yes  If No:  Plavix use:  No  Beta blocker use:  Yes  ARB use:  No ACEI use:  No CCB use:  No

## 2016-01-06 NOTE — Progress Notes (Signed)
Pt discharged home with wife Discharge instructions given & reviewed Education discussed  IV dc'd  Tele dc'd  Pt discharged via wheelchair per NT All patient belongings at side, home O2 sent with patient   Darrel HooverWilson,Jaelyn Cloninger S 3:03 PM

## 2016-01-06 NOTE — Progress Notes (Addendum)
Pt due to be discharged, only able to urinate 75cc, bladder scan shows 302cc remaining in bladder, Samantha PA paged but in surgery, Maureen PA paged and will come see patient, discharge pending at this time, will continue to monitor.   [name],[name] 1:43 PM    Maureen PA verbal order to in and out cath pt and then discharge him, pt refused and PA at bedside when pt stated this, per PA okay to discharge him home and instructed to come to ER if unable to urinate in the next 4 hours. [name],[name] 2:25 PM

## 2016-01-06 NOTE — Care Management Note (Addendum)
Case Management Note  Patient Details  Name: Kenneth GEIL Sr. MRN: [id number] Date of Birth: [birth year]  Subjective/Objective:       S/p bil common fem artery cannulation             Action/Plan:  PTA from home with wife - wife at bedside during assessment.  Pt stated hs has had back problems for approx 30 years and uses walker and cane in the home, they also have a wheelchair for when pt needs to go longer distances outside of the home.  CM offered PT eval - both wife and pt refused as they stated pt is at his baseline.  Pt has home oxygen 2 L supplied by [facility] - both pt and wife verified that there is working oxygen in the home - however request portable tank.  CM contacted [facility] and requested portable tank - tank will be delivered prior to discharge.   Expected Discharge Date:                  Expected Discharge Plan:  [place]  In-House Referral:     Discharge planning Services  CM Consult  Post Acute Care Choice:    Choice offered to:  Patient  DME Arranged:  Oxygen DME Agency:  [place] Arranged:  RN, PT, OT [facility] Agency:  [place]  Status of Service:  Completed, signed off  If discussed at [place] of Stay Meetings, dates discussed:    Additional Comments: CM contacted by Tiffany with [facility] - pt was set up for [facility] RN PT and OT by doctors office prior to surgery  (orders received and referral accepted).  CM met with pt and wife at bedside and are in agreement with [facility] and [facility] as the provider. Maryclare Labrador, RN 01/06/2016, 10:49 AM

## 2016-01-08 DIAGNOSIS — Z95828 Presence of other vascular implants and grafts: Secondary | ICD-10-CM | POA: Diagnosis not present

## 2016-01-08 DIAGNOSIS — M5440 Lumbago with sciatica, unspecified side: Secondary | ICD-10-CM | POA: Diagnosis not present

## 2016-01-08 DIAGNOSIS — Z48812 Encounter for surgical aftercare following surgery on the circulatory system: Secondary | ICD-10-CM | POA: Diagnosis not present

## 2016-01-08 DIAGNOSIS — I272 Pulmonary hypertension, unspecified: Secondary | ICD-10-CM | POA: Diagnosis not present

## 2016-01-08 DIAGNOSIS — I2510 Atherosclerotic heart disease of native coronary artery without angina pectoris: Secondary | ICD-10-CM | POA: Diagnosis not present

## 2016-01-08 DIAGNOSIS — I714 Abdominal aortic aneurysm, without rupture: Secondary | ICD-10-CM | POA: Diagnosis not present

## 2016-01-09 DIAGNOSIS — I272 Pulmonary hypertension, unspecified: Secondary | ICD-10-CM | POA: Diagnosis not present

## 2016-01-09 DIAGNOSIS — I2510 Atherosclerotic heart disease of native coronary artery without angina pectoris: Secondary | ICD-10-CM | POA: Diagnosis not present

## 2016-01-09 DIAGNOSIS — I714 Abdominal aortic aneurysm, without rupture: Secondary | ICD-10-CM | POA: Diagnosis not present

## 2016-01-09 DIAGNOSIS — Z95828 Presence of other vascular implants and grafts: Secondary | ICD-10-CM | POA: Diagnosis not present

## 2016-01-09 DIAGNOSIS — M5440 Lumbago with sciatica, unspecified side: Secondary | ICD-10-CM | POA: Diagnosis not present

## 2016-01-09 DIAGNOSIS — Z48812 Encounter for surgical aftercare following surgery on the circulatory system: Secondary | ICD-10-CM | POA: Diagnosis not present

## 2016-01-10 DIAGNOSIS — M5440 Lumbago with sciatica, unspecified side: Secondary | ICD-10-CM | POA: Diagnosis not present

## 2016-01-10 DIAGNOSIS — Z95828 Presence of other vascular implants and grafts: Secondary | ICD-10-CM | POA: Diagnosis not present

## 2016-01-10 DIAGNOSIS — I272 Pulmonary hypertension, unspecified: Secondary | ICD-10-CM | POA: Diagnosis not present

## 2016-01-10 DIAGNOSIS — Z48812 Encounter for surgical aftercare following surgery on the circulatory system: Secondary | ICD-10-CM | POA: Diagnosis not present

## 2016-01-10 DIAGNOSIS — I2510 Atherosclerotic heart disease of native coronary artery without angina pectoris: Secondary | ICD-10-CM | POA: Diagnosis not present

## 2016-01-10 DIAGNOSIS — I714 Abdominal aortic aneurysm, without rupture: Secondary | ICD-10-CM | POA: Diagnosis not present

## 2016-01-11 DIAGNOSIS — I272 Pulmonary hypertension, unspecified: Secondary | ICD-10-CM | POA: Diagnosis not present

## 2016-01-11 DIAGNOSIS — Z48812 Encounter for surgical aftercare following surgery on the circulatory system: Secondary | ICD-10-CM | POA: Diagnosis not present

## 2016-01-11 DIAGNOSIS — I714 Abdominal aortic aneurysm, without rupture: Secondary | ICD-10-CM | POA: Diagnosis not present

## 2016-01-11 DIAGNOSIS — I2510 Atherosclerotic heart disease of native coronary artery without angina pectoris: Secondary | ICD-10-CM | POA: Diagnosis not present

## 2016-01-11 DIAGNOSIS — M5440 Lumbago with sciatica, unspecified side: Secondary | ICD-10-CM | POA: Diagnosis not present

## 2016-01-11 DIAGNOSIS — Z95828 Presence of other vascular implants and grafts: Secondary | ICD-10-CM | POA: Diagnosis not present

## 2016-01-16 ENCOUNTER — Telehealth: Payer: Self-pay | Admitting: *Deleted

## 2016-01-16 DIAGNOSIS — M5440 Lumbago with sciatica, unspecified side: Secondary | ICD-10-CM | POA: Diagnosis not present

## 2016-01-16 DIAGNOSIS — I272 Pulmonary hypertension, unspecified: Secondary | ICD-10-CM | POA: Diagnosis not present

## 2016-01-16 DIAGNOSIS — I2510 Atherosclerotic heart disease of native coronary artery without angina pectoris: Secondary | ICD-10-CM | POA: Diagnosis not present

## 2016-01-16 DIAGNOSIS — Z48812 Encounter for surgical aftercare following surgery on the circulatory system: Secondary | ICD-10-CM | POA: Diagnosis not present

## 2016-01-16 DIAGNOSIS — I714 Abdominal aortic aneurysm, without rupture: Secondary | ICD-10-CM | POA: Diagnosis not present

## 2016-01-16 DIAGNOSIS — Z95828 Presence of other vascular implants and grafts: Secondary | ICD-10-CM | POA: Diagnosis not present

## 2016-01-16 NOTE — Telephone Encounter (Signed)
Patient called to report a small "knot" that has formed around the left groin incision (EVAR 01-05-16 by Dr. [name]). He 1st saw it this morning after [facility] removed his dressing. She did not mention any problem with this area per patient. Pt is afebrile, has no pain, no erythema and no drainage is noted. He is voiding appropriately and is having BMs. Mr. [name] is going to call me tomorrow with an update and he knows to go to the ED if he has any severe pain, fever etc. Patient voiced understanding and agreement with this plan.

## 2016-01-17 DIAGNOSIS — I714 Abdominal aortic aneurysm, without rupture: Secondary | ICD-10-CM | POA: Diagnosis not present

## 2016-01-17 DIAGNOSIS — I272 Pulmonary hypertension, unspecified: Secondary | ICD-10-CM | POA: Diagnosis not present

## 2016-01-17 DIAGNOSIS — M5440 Lumbago with sciatica, unspecified side: Secondary | ICD-10-CM | POA: Diagnosis not present

## 2016-01-17 DIAGNOSIS — I2510 Atherosclerotic heart disease of native coronary artery without angina pectoris: Secondary | ICD-10-CM | POA: Diagnosis not present

## 2016-01-17 DIAGNOSIS — Z48812 Encounter for surgical aftercare following surgery on the circulatory system: Secondary | ICD-10-CM | POA: Diagnosis not present

## 2016-01-17 DIAGNOSIS — Z95828 Presence of other vascular implants and grafts: Secondary | ICD-10-CM | POA: Diagnosis not present

## 2016-01-24 DIAGNOSIS — I272 Pulmonary hypertension, unspecified: Secondary | ICD-10-CM | POA: Diagnosis not present

## 2016-01-24 DIAGNOSIS — M5440 Lumbago with sciatica, unspecified side: Secondary | ICD-10-CM | POA: Diagnosis not present

## 2016-01-24 DIAGNOSIS — I714 Abdominal aortic aneurysm, without rupture: Secondary | ICD-10-CM | POA: Diagnosis not present

## 2016-01-24 DIAGNOSIS — I2510 Atherosclerotic heart disease of native coronary artery without angina pectoris: Secondary | ICD-10-CM | POA: Diagnosis not present

## 2016-01-24 DIAGNOSIS — Z48812 Encounter for surgical aftercare following surgery on the circulatory system: Secondary | ICD-10-CM | POA: Diagnosis not present

## 2016-01-24 DIAGNOSIS — Z95828 Presence of other vascular implants and grafts: Secondary | ICD-10-CM | POA: Diagnosis not present

## 2016-01-25 DIAGNOSIS — Z95828 Presence of other vascular implants and grafts: Secondary | ICD-10-CM | POA: Diagnosis not present

## 2016-01-25 DIAGNOSIS — Z48812 Encounter for surgical aftercare following surgery on the circulatory system: Secondary | ICD-10-CM | POA: Diagnosis not present

## 2016-01-25 DIAGNOSIS — I714 Abdominal aortic aneurysm, without rupture: Secondary | ICD-10-CM | POA: Diagnosis not present

## 2016-01-25 DIAGNOSIS — M5440 Lumbago with sciatica, unspecified side: Secondary | ICD-10-CM | POA: Diagnosis not present

## 2016-01-25 DIAGNOSIS — I272 Pulmonary hypertension, unspecified: Secondary | ICD-10-CM | POA: Diagnosis not present

## 2016-01-25 DIAGNOSIS — I2510 Atherosclerotic heart disease of native coronary artery without angina pectoris: Secondary | ICD-10-CM | POA: Diagnosis not present

## 2016-01-26 DIAGNOSIS — Z95828 Presence of other vascular implants and grafts: Secondary | ICD-10-CM | POA: Diagnosis not present

## 2016-01-26 DIAGNOSIS — I272 Pulmonary hypertension, unspecified: Secondary | ICD-10-CM | POA: Diagnosis not present

## 2016-01-26 DIAGNOSIS — I714 Abdominal aortic aneurysm, without rupture: Secondary | ICD-10-CM | POA: Diagnosis not present

## 2016-01-26 DIAGNOSIS — I2510 Atherosclerotic heart disease of native coronary artery without angina pectoris: Secondary | ICD-10-CM | POA: Diagnosis not present

## 2016-01-26 DIAGNOSIS — M5440 Lumbago with sciatica, unspecified side: Secondary | ICD-10-CM | POA: Diagnosis not present

## 2016-01-26 DIAGNOSIS — Z48812 Encounter for surgical aftercare following surgery on the circulatory system: Secondary | ICD-10-CM | POA: Diagnosis not present

## 2016-01-27 DIAGNOSIS — Z95828 Presence of other vascular implants and grafts: Secondary | ICD-10-CM | POA: Diagnosis not present

## 2016-01-27 DIAGNOSIS — Z48812 Encounter for surgical aftercare following surgery on the circulatory system: Secondary | ICD-10-CM | POA: Diagnosis not present

## 2016-01-27 DIAGNOSIS — I272 Pulmonary hypertension, unspecified: Secondary | ICD-10-CM | POA: Diagnosis not present

## 2016-01-27 DIAGNOSIS — M5440 Lumbago with sciatica, unspecified side: Secondary | ICD-10-CM | POA: Diagnosis not present

## 2016-01-27 DIAGNOSIS — I714 Abdominal aortic aneurysm, without rupture: Secondary | ICD-10-CM | POA: Diagnosis not present

## 2016-01-27 DIAGNOSIS — I2510 Atherosclerotic heart disease of native coronary artery without angina pectoris: Secondary | ICD-10-CM | POA: Diagnosis not present

## 2016-01-31 DIAGNOSIS — H547 Unspecified visual loss: Secondary | ICD-10-CM | POA: Diagnosis not present

## 2016-01-31 DIAGNOSIS — N184 Chronic kidney disease, stage 4 (severe): Secondary | ICD-10-CM | POA: Diagnosis not present

## 2016-01-31 DIAGNOSIS — I2510 Atherosclerotic heart disease of native coronary artery without angina pectoris: Secondary | ICD-10-CM | POA: Diagnosis not present

## 2016-01-31 DIAGNOSIS — Z79899 Other long term (current) drug therapy: Secondary | ICD-10-CM | POA: Diagnosis not present

## 2016-01-31 DIAGNOSIS — I714 Abdominal aortic aneurysm, without rupture: Secondary | ICD-10-CM | POA: Diagnosis not present

## 2016-01-31 DIAGNOSIS — J449 Chronic obstructive pulmonary disease, unspecified: Secondary | ICD-10-CM | POA: Diagnosis not present

## 2016-01-31 DIAGNOSIS — I272 Pulmonary hypertension, unspecified: Secondary | ICD-10-CM | POA: Diagnosis not present

## 2016-02-01 DIAGNOSIS — Z95828 Presence of other vascular implants and grafts: Secondary | ICD-10-CM | POA: Diagnosis not present

## 2016-02-01 DIAGNOSIS — Z48812 Encounter for surgical aftercare following surgery on the circulatory system: Secondary | ICD-10-CM | POA: Diagnosis not present

## 2016-02-01 DIAGNOSIS — M5440 Lumbago with sciatica, unspecified side: Secondary | ICD-10-CM | POA: Diagnosis not present

## 2016-02-01 DIAGNOSIS — I2510 Atherosclerotic heart disease of native coronary artery without angina pectoris: Secondary | ICD-10-CM | POA: Diagnosis not present

## 2016-02-01 DIAGNOSIS — I272 Pulmonary hypertension, unspecified: Secondary | ICD-10-CM | POA: Diagnosis not present

## 2016-02-01 DIAGNOSIS — N184 Chronic kidney disease, stage 4 (severe): Secondary | ICD-10-CM | POA: Diagnosis not present

## 2016-02-01 DIAGNOSIS — I129 Hypertensive chronic kidney disease with stage 1 through stage 4 chronic kidney disease, or unspecified chronic kidney disease: Secondary | ICD-10-CM | POA: Diagnosis not present

## 2016-02-01 DIAGNOSIS — I714 Abdominal aortic aneurysm, without rupture: Secondary | ICD-10-CM | POA: Diagnosis not present

## 2016-02-02 DIAGNOSIS — I272 Pulmonary hypertension, unspecified: Secondary | ICD-10-CM | POA: Diagnosis not present

## 2016-02-02 DIAGNOSIS — M5440 Lumbago with sciatica, unspecified side: Secondary | ICD-10-CM | POA: Diagnosis not present

## 2016-02-02 DIAGNOSIS — Z48812 Encounter for surgical aftercare following surgery on the circulatory system: Secondary | ICD-10-CM | POA: Diagnosis not present

## 2016-02-02 DIAGNOSIS — I714 Abdominal aortic aneurysm, without rupture: Secondary | ICD-10-CM | POA: Diagnosis not present

## 2016-02-02 DIAGNOSIS — Z95828 Presence of other vascular implants and grafts: Secondary | ICD-10-CM | POA: Diagnosis not present

## 2016-02-02 DIAGNOSIS — I2510 Atherosclerotic heart disease of native coronary artery without angina pectoris: Secondary | ICD-10-CM | POA: Diagnosis not present

## 2016-02-03 DIAGNOSIS — I714 Abdominal aortic aneurysm, without rupture: Secondary | ICD-10-CM | POA: Diagnosis not present

## 2016-02-03 DIAGNOSIS — I272 Pulmonary hypertension, unspecified: Secondary | ICD-10-CM | POA: Diagnosis not present

## 2016-02-03 DIAGNOSIS — Z48812 Encounter for surgical aftercare following surgery on the circulatory system: Secondary | ICD-10-CM | POA: Diagnosis not present

## 2016-02-03 DIAGNOSIS — M5440 Lumbago with sciatica, unspecified side: Secondary | ICD-10-CM | POA: Diagnosis not present

## 2016-02-03 DIAGNOSIS — I2510 Atherosclerotic heart disease of native coronary artery without angina pectoris: Secondary | ICD-10-CM | POA: Diagnosis not present

## 2016-02-03 DIAGNOSIS — Z95828 Presence of other vascular implants and grafts: Secondary | ICD-10-CM | POA: Diagnosis not present

## 2016-02-10 ENCOUNTER — Encounter: Payer: Self-pay | Admitting: Pulmonary Disease

## 2016-02-13 ENCOUNTER — Institutional Professional Consult (permissible substitution): Payer: Medicare Other | Admitting: Pulmonary Disease

## 2016-02-15 DIAGNOSIS — N184 Chronic kidney disease, stage 4 (severe): Secondary | ICD-10-CM | POA: Diagnosis not present

## 2016-02-15 DIAGNOSIS — I129 Hypertensive chronic kidney disease with stage 1 through stage 4 chronic kidney disease, or unspecified chronic kidney disease: Secondary | ICD-10-CM | POA: Diagnosis not present

## 2016-02-17 ENCOUNTER — Other Ambulatory Visit (INDEPENDENT_AMBULATORY_CARE_PROVIDER_SITE_OTHER): Payer: Medicare Other

## 2016-02-17 ENCOUNTER — Ambulatory Visit (INDEPENDENT_AMBULATORY_CARE_PROVIDER_SITE_OTHER)
Admission: RE | Admit: 2016-02-17 | Discharge: 2016-02-17 | Disposition: A | Discharge status: Home / Self Care | Payer: Medicare Other | Source: Ambulatory Visit | Attending: Internal Medicine | Admitting: Internal Medicine

## 2016-02-17 ENCOUNTER — Encounter: Payer: Self-pay | Admitting: Internal Medicine

## 2016-02-17 ENCOUNTER — Ambulatory Visit (INDEPENDENT_AMBULATORY_CARE_PROVIDER_SITE_OTHER): Payer: Medicare Other | Admitting: Internal Medicine

## 2016-02-17 VITALS — BP 134/82 | HR 75 | Ht 69.0 in | Wt 162.0 lb

## 2016-02-17 DIAGNOSIS — N184 Chronic kidney disease, stage 4 (severe): Secondary | ICD-10-CM

## 2016-02-17 DIAGNOSIS — I272 Pulmonary hypertension, unspecified: Secondary | ICD-10-CM | POA: Diagnosis not present

## 2016-02-17 DIAGNOSIS — J9611 Chronic respiratory failure with hypoxia: Secondary | ICD-10-CM | POA: Insufficient documentation

## 2016-02-17 DIAGNOSIS — R0609 Other forms of dyspnea: Secondary | ICD-10-CM | POA: Diagnosis not present

## 2016-02-17 DIAGNOSIS — R0600 Dyspnea, unspecified: Secondary | ICD-10-CM

## 2016-02-17 LAB — CBC WITH DIFFERENTIAL/PLATELET
Basophils Absolute: 0 10*3/uL (ref 0.0–0.1)
Basophils Relative: 0.3 % (ref 0.0–3.0)
EOS PCT: 5.3 % — AB (ref 0.0–5.0)
Eosinophils Absolute: 0.6 10*3/uL (ref 0.0–0.7)
HEMATOCRIT: 51.4 % (ref 39.0–52.0)
HEMOGLOBIN: 17.1 g/dL — AB (ref 13.0–17.0)
LYMPHS ABS: 1.7 10*3/uL (ref 0.7–4.0)
LYMPHS PCT: 15.1 % (ref 12.0–46.0)
MCHC: 33.3 g/dL (ref 30.0–36.0)
MCV: 98.8 fl (ref 78.0–100.0)
MONOS PCT: 6.3 % (ref 3.0–12.0)
Monocytes Absolute: 0.7 10*3/uL (ref 0.1–1.0)
Neutro Abs: 8.2 10*3/uL — ABNORMAL HIGH (ref 1.4–7.7)
Neutrophils Relative %: 73 % (ref 43.0–77.0)
Platelets: 133 10*3/uL — ABNORMAL LOW (ref 150.0–400.0)
RBC: 5.2 Mil/uL (ref 4.22–5.81)
RDW: 16.6 % — ABNORMAL HIGH (ref 11.5–15.5)
WBC: 11.3 10*3/uL — AB (ref 4.0–10.5)

## 2016-02-17 LAB — BASIC METABOLIC PANEL
BUN: 45 mg/dL — AB (ref 6–23)
CALCIUM: 10.4 mg/dL (ref 8.4–10.5)
CHLORIDE: 99 meq/L (ref 96–112)
CO2: 32 meq/L (ref 19–32)
CREATININE: 2.49 mg/dL — AB (ref 0.40–1.50)
GFR: 26.67 mL/min — ABNORMAL LOW (ref >60.00)
GLUCOSE: 105 mg/dL — AB (ref 70–99)
Potassium: 4.1 mEq/L (ref 3.5–5.1)
Sodium: 139 mEq/L (ref 135–145)

## 2016-02-17 LAB — BRAIN NATRIURETIC PEPTIDE: Pro B Natriuretic peptide (BNP): 49 pg/mL (ref 0.0–100.0)

## 2016-02-17 LAB — SEDIMENTATION RATE: Sed Rate: 10 mm/hr (ref 0–20)

## 2016-02-17 NOTE — Progress Notes (Signed)
Subjective:     Patient ID: [name]., male   DOB: Jul 04, 1934,  MRN: [id number]  HPI  53 yowm quit 1973 no symptoms with chronic back pain x 2016 slowed him down but not his breathing other than needing nasal sprays that really don't help  > MRI showed aneurysm and post op resp failure > referred to pulmonary clinic 02/17/2016 by Dr   Micheal Likens for ? [place]  Admission Date: 01/05/2016  Discharge Date: 01/06/16  Physician: Conrad [place], MD  Admission Diagnosis: Abdominal aortic aneurysm I71.4   HPI:   This is a 82 y.o. male who presents with chief complaint: back pain. This patient had recurrence of his previous back pain associated with DDD with radicular sx in his leg similar to previous episodes. The patient had a MRI completed at an outside facility which was read as possible contained rupture of AAA. Pt currently denies back or abdominal pain. The patient notes nohistory of embolic episodes from the AAA. The patient's risk factors for AAA included: age and sex and prior smoker. The patient does notsmoke cigarettes.    Admission Date: 01/05/2016  Discharge Date: 01/06/16  Hospital Course:  The patient was admitted to the hospital and taken to the operating room on 01/05/2016 and underwent: 1. Bilateral common femoral artery cannulation under ultrasound guidance 2. "Preclose" repair of bilateral common femoral artery  3. Placement of catheter in aorta x 2 4. Aortogram 5. Placement of bifurcated aortic endograft (20m x 14 mm x 14cm)  7. Placement of right iliac limb (124mx 13.5cm) 8. Placement of right iliac limb extension (1877m 13.5cm) 9. Placement of left iliac limb extension (27 mm x 12 cm) Radiologic S&I    The pt tolerated the procedure well and was transported to the PACU in good condition.   He did have a grade I hematoma left groin in the recovery room by report from the nurse.  The pt continued to have doppler signals in bilateral feet.     By POD 1, he did have some ecchymosis of the left groin, however, the hematoma was stable.  He did have acute surgical blood loss anemia and was tolerating this.  He has baseline renal dysfunction and this remained stable postoperatively.  His foley was removed and he was able to void about 75cc.  The pt was going to receive an I&O cath, however, the pt refused.  He is discharged home with instructions to return in 4 hours if he has not voided.     The remainder of the hospital course consisted of increasing mobilization and increasing intake of solids without difficulty.  CBC Labs(Brief)          Component Value Date/Time   WBC 8.1 01/06/2016 0315   RBC 4.18 (L) 01/06/2016 0315   HGB 13.5 01/06/2016 0315   HCT 41.2 01/06/2016 0315   PLT 100 (L) 01/06/2016 0315   MCV 98.6 01/06/2016 0315   MCH 32.3 01/06/2016 0315   MCHC 32.8 01/06/2016 0315   RDW 15.1 01/06/2016 0315   LYMPHSABS 1.5 12/28/2015 0316   MONOABS 0.6 12/28/2015 0316   EOSABS 0.6 12/28/2015 0316   BASOSABS 0.1 12/28/2015 0316      BMET Labs(Brief)          Component Value Date/Time   NA 137 01/06/2016 0315   K 4.5 01/06/2016 0315   CL 109 01/06/2016 0315   CO2 21 (L) 01/06/2016 0315   GLUCOSE 113 (H) 01/06/2016 0315  BUN 24 (H) 01/06/2016 0315   CREATININE 2.01 (H) 01/06/2016 0315   CALCIUM 8.5 (L) 01/06/2016 0315   GFRNONAA 30 (L) 01/06/2016 0315   GFRAA 35 (L) 01/06/2016 0315             Discharge Instructions    ABDOMINAL PROCEDURE/ANEURYSM REPAIR/AORTO-BIFEMORAL BYPASS:  Call MD for increased abdominal pain; cramping diarrhea; nausea/vomiting    Complete by:  As directed   Call MD for:  redness, tenderness, or signs of infection (pain, swelling, bleeding, redness, odor or green/yellow discharge around incision site)    Complete by:  As directed   Call MD for:  severe or increased pain, loss or decreased feeling  in affected limb(s)    Complete by:  As  directed   Call MD for:  temperature >100.5    Complete by:  As directed   Discharge wound care:    Complete by:  As directed   Shower daily with soap and water starting 01/07/16   Driving Restrictions    Complete by:  As directed   No driving for 2 weeks   Lifting restrictions    Complete by:  As directed   No lifting for 4 weeks   Resume previous diet    Complete by:  As directed      Discharge Diagnosis:  Abdominal aortic aneurysm I71.4  Secondary Diagnosis:     Patient Active Problem List   Diagnosis Date Noted  . S/P AAA repair using bifurcation graft 01/05/2016  . Acute right-sided low back pain with sciatica   . 3-vessel CAD   . Pulmonary hypertension   . DOE (dyspnea on exertion)   . Muscle weakness (generalized)   . Abdominal aortic aneurysm (AAA) without rupture ([place]) 12/23/2015  . Stage 4 chronic kidney disease ([place]) 06/10/2015       Past Medical History:  Diagnosis Date  . Abdominal aortic aneurysm (AAA) ([place])   . Arteriosclerotic cardiovascular disease   . Chronic kidney disease   . Chronic kidney disease, stage IV (severe) ([place])   . Coronary artery disease   . DJD (degenerative joint disease)   . Dyspnea    on O2 2 L 24/7  . Hypertension   . Hypertensive renal disease   . Pneumonia    as an child  . Renal tubular acidosis   . Secondary hyperparathyroidism ([place])        Medication List    TAKE these medications   acetaminophen 325 MG tablet Commonly known as:  TYLENOL Take 2 tablets (650 mg total) by mouth every 4 (four) hours as needed for headache or mild pain.  allopurinol 300 MG tablet Commonly known as:  ZYLOPRIM Take 300 mg by mouth daily.  aspirin EC 81 MG tablet Take 81 mg by mouth daily.  calcium acetate 667 MG capsule Commonly known as:  PHOSLO Take 667 mg by mouth 2 (two) times daily with a meal.  fluticasone 50 MCG/ACT nasal spray Commonly known as:  FLONASE Place 1 spray into both nostrils  daily.  loratadine 10 MG tablet Commonly known as:  CLARITIN Take 10 mg by mouth at bedtime.  metoprolol succinate 50 MG 24 hr tablet Commonly known as:  TOPROL-XL Take 50 mg by mouth at bedtime.  mometasone-formoterol 100-5 MCG/ACT Aero Commonly known as:  DULERA Inhale 2 puffs into the lungs 2 (two) times daily. What changed:  when to take this  nitroGLYCERIN 0.4 MG SL tablet Commonly known as:  NITROSTAT Place 1 tablet (0.4 mg  total) under the tongue every 5 (five) minutes as needed for chest pain.  oxyCODONE 5 MG immediate release tablet Commonly known as:  ROXICODONE Take 1 tablet (5 mg total) by mouth every 6 (six) hours as needed for severe pain.  RENA-VITE RX 1 MG Tabs Take 1 mg by mouth daily.  rosuvastatin 20 MG tablet Commonly known as:  CRESTOR Take 20 mg by mouth daily at 12 noon.  sodium bicarbonate 650 MG tablet Take 1,300 mg by mouth 2 (two) times daily.  VENTOLIN HFA 108 (90 Base) MCG/ACT inhaler Generic drug:  albuterol Inhale 1-2 puffs into the lungs every 4 (four) hours as needed for wheezing.      Prescriptions given: Roxicodone #10 No Refill  Instructions: 1.  No heavy lifting x 4 weeks 2.  No driving x 2 weeks and while taking pain medication 3.  Shower daily starting 01/07/16  Disposition: home  Patient's condition: is Good.  He is on home O2.           Addendum  I agree with my PA's discharge summary.  This patient with high risk cardiopulmonary status underwent EVAR for a large AAA >8 cm.  The EVAR was completed with spinal anesthesia and MAC.  His post-operative course was unremarkable except for minor urinary retention that never became symptomatic.  The patient will follow up in 4 weeks with a CTA abd/pelvis.      02/17/2016 1st [facility] office visit/ Anuel Sitter  Re post op resp failure  Chief Complaint  Patient presents with  . Pulmonary Consult    Referred by Dr. Micheal Likens. Pt states discharged from hospital on o2 after  having surgery for AAA.  He is having SOB walking approx 10 ft, but relates this to having back pain.   nasal drainage 24/7 x age 71 nothing ever helps including ent/ allergy eval per pt only better lived at [facility] x 3 years Placed on dulera 100 2 each am  at d/c empirically but doesn't think this helps breathing which doesn't so much limited as does weak legs and back pain and no problem at hs on 2lpm       Review of Systems     Objective:   Physical Exam    w/c bound/ slt rattling on voluntary cough maneuver  Wt Readings from Last 3 Encounters:  02/17/16 162 lb (73.5 kg)  01/06/16 160 lb 4.4 oz (72.7 kg)  01/04/16 172 lb (78 kg)    Vital signs reviewed  - - Note on arrival 02 sats  97% on 2lpm and on RA = 92%   HEENT: nl  turbinates, and oropharynx. Nl external ear canals without cough reflex - full dentures    NECK :  without JVD/Nodes/TM/ nl carotid upstrokes bilaterally   LUNGS: no acc muscle use,  Nl contour chest with classic coarse BV changes bilaterally worse in bases    CV:  RRR  no s3 or murmur or increase in P2, nad no edema   ABD:  soft and nontender with nl inspiratory excursion in the supine position. No bruits or organomegaly appreciated, bowel sounds nl  MS:   ext warm without deformities, calf tenderness, cyanosis or clubbing No obvious joint restrictions / some muscle wasting present   SKIN: warm and dry without lesions    NEURO:  alert, approp, nl sensorium with  no motor or cerebellar deficits apparent.     CXR PA and Lateral:   02/17/2016 :    I personally  reviewed images and agree with radiology impression as follows:    Stable chronic reticular interstitial prominence and peripheral fibrotic changes. No definite superimposed infiltrate or pulmonary Edema.   Labs ordered/ reviewed:      Chemistry      Component Value Date/Time   NA 139 02/17/2016 1207   K 4.1 02/17/2016 1207   CL 99 02/17/2016 1207   CO2 32 02/17/2016 1207   BUN 45 (H)  02/17/2016 1207   CREATININE 2.49 (H) 02/17/2016 1207      Component Value Date/Time   CALCIUM 10.4 02/17/2016 1207   ALKPHOS 58 12/28/2015 0316   AST 19 12/28/2015 0316   ALT 8 (L) 12/28/2015 0316   BILITOT 0.8 12/28/2015 0316        Lab Results  Component Value Date   WBC 11.3 (H) 02/17/2016   HGB 17.1 (H) 02/17/2016   HCT 51.4 02/17/2016   MCV 98.8 02/17/2016   PLT 133.0 (L) 02/17/2016        Lab Results  Component Value Date   PROBNP 49.0 02/17/2016       Lab Results  Component Value Date   ESRSEDRATE 10 02/17/2016   ESRSEDRATE 2 12/26/2015          Assessment:

## 2016-02-17 NOTE — Progress Notes (Signed)
Spoke with pt and notified of results per Dr. Wert. Pt verbalized understanding and denied any questions. 

## 2016-02-17 NOTE — Patient Instructions (Signed)
Ok to leave off oxygen at rest but goal is to maintain over 90% at all times  Please remember to go to the lab and x-ray department downstairs for your tests - we will call you with the results when they are available.    Please schedule a follow up office visit in 4 weeks, sooner if needed

## 2016-02-18 NOTE — Assessment & Plan Note (Signed)
Lab Results  Component Value Date   CREATININE 2.49 (H) 02/17/2016   CREATININE 2.01 (H) 01/06/2016   CREATININE 1.86 (H) 01/05/2016    Trend upward noted Follow up per Primary Care planned

## 2016-02-18 NOTE — Assessment & Plan Note (Signed)
Started 02 post hosp for AAA 12/09/18/17 ? Related to ALI/ basilar atx   As of 02/17/2016  sats ok RA/ needs 02 at hs and with activity once he starts being more active and needs to monitor sats to mainatin > 90% sat at all times as has element of PH

## 2016-02-18 NOTE — Assessment & Plan Note (Addendum)
Spirometry 02/17/2016  FEV1 1.75 (63%)  Ratio 68  s rx prior and with very flat exp f/v curve  Multifactorial but mostly related to severe deconditioning /  ALI s/p aneurysm repair and should gradually improve s intervention -  No evidence of signifant copd or primary lung dz but will f/u in 6 weeks to be sure he's continuing to make progress as most pts with ALI improve by 6 m out  In meantime no need for dulera unless it helps him   Total time devoted to counseling  > 50 % of new pt office visit in pt with persistent non-specific resp complaints of unknown origin :  review case with pt/ discussion of options/alternatives/ personally creating written customized instructions  in presence of pt  then going over those specific  Instructions directly with the pt including how to use all of the meds but in particular covering each new medication in detail and the difference between the maintenance/automatic meds and the prns using an action plan format for the latter.  Please see AVS from this visit for a full list of these instructions which I personally wrote for this pt and  are unique to this visit.

## 2016-02-18 NOTE — Assessment & Plan Note (Signed)
[facility] 12/27/15 Ao = 124/70 (93) RA =  3 RV = 60/5 PA = 56/16 (34) PCW = 9 Fick cardiac output/index = 4.7/2.6 PVR = 5.4 WU FA sat = 91% PA sat = 68%  Main rx in setting of lung dz is 02 and treat the lung dz if possible but don't see enough copd here to warrant anything more than prs saba   I had an extended discussion with the patient reviewing all relevant studies completed to date and  lasting 25 minutes of a 40  minute outpt/transition of care office visit for pt not previously known to me   re  non-specific but potentially very serious pulmonary symptoms of unknown etiology.  Each maintenance medication was reviewed in detail including most importantly the difference between maintenance and prns and under what circumstances the prns are to be triggered using an action plan format that is not reflected in the computer generated alphabetically organized AVS.    Please see AVS for specific instructions unique to this office visit that I personally wrote and verbalized to the the pt in detail and then reviewed with pt  by my nurse highlighting any  changes in therapy recommended at today's visit to their plan of care.

## 2016-02-20 ENCOUNTER — Telehealth: Payer: Self-pay | Admitting: Internal Medicine

## 2016-02-20 DIAGNOSIS — I2510 Atherosclerotic heart disease of native coronary artery without angina pectoris: Secondary | ICD-10-CM | POA: Diagnosis not present

## 2016-02-20 DIAGNOSIS — M1990 Unspecified osteoarthritis, unspecified site: Secondary | ICD-10-CM | POA: Diagnosis not present

## 2016-02-20 DIAGNOSIS — N2581 Secondary hyperparathyroidism of renal origin: Secondary | ICD-10-CM | POA: Diagnosis not present

## 2016-02-20 DIAGNOSIS — I129 Hypertensive chronic kidney disease with stage 1 through stage 4 chronic kidney disease, or unspecified chronic kidney disease: Secondary | ICD-10-CM | POA: Diagnosis not present

## 2016-02-20 DIAGNOSIS — M5030 Other cervical disc degeneration, unspecified cervical region: Secondary | ICD-10-CM | POA: Diagnosis not present

## 2016-02-20 DIAGNOSIS — I770 Arteriovenous fistula, acquired: Secondary | ICD-10-CM | POA: Diagnosis not present

## 2016-02-20 DIAGNOSIS — Z9889 Other specified postprocedural states: Secondary | ICD-10-CM | POA: Diagnosis not present

## 2016-02-20 DIAGNOSIS — N184 Chronic kidney disease, stage 4 (severe): Secondary | ICD-10-CM | POA: Diagnosis not present

## 2016-02-20 DIAGNOSIS — N2589 Other disorders resulting from impaired renal tubular function: Secondary | ICD-10-CM | POA: Diagnosis not present

## 2016-02-20 NOTE — Telephone Encounter (Signed)
Notes Recorded by Nyoka CowdenMichael B Wert, MD on 02/18/2016 at 2:35 PM EST Call patient : Studies are unremarkable, no change in recs   Pt aware of results & voiced his understanding. Nothing further needed.

## 2016-02-20 NOTE — Progress Notes (Signed)
LMTCB

## 2016-02-21 ENCOUNTER — Encounter: Payer: Self-pay | Admitting: Vascular Surgery

## 2016-02-24 ENCOUNTER — Encounter: Payer: Medicare Other | Admitting: Vascular Surgery

## 2016-02-24 ENCOUNTER — Other Ambulatory Visit: Payer: Medicare Other

## 2016-02-27 ENCOUNTER — Other Ambulatory Visit: Payer: Self-pay | Admitting: *Deleted

## 2016-02-27 DIAGNOSIS — I714 Abdominal aortic aneurysm, without rupture, unspecified: Secondary | ICD-10-CM

## 2016-02-27 DIAGNOSIS — Z95828 Presence of other vascular implants and grafts: Secondary | ICD-10-CM

## 2016-02-28 ENCOUNTER — Encounter: Payer: Self-pay | Admitting: Vascular Surgery

## 2016-02-29 ENCOUNTER — Ambulatory Visit
Admission: RE | Admit: 2016-02-29 | Discharge: 2016-02-29 | Disposition: A | Discharge status: Home / Self Care | Payer: Medicare Other | Source: Ambulatory Visit | Attending: Vascular Surgery | Admitting: Vascular Surgery

## 2016-02-29 DIAGNOSIS — I714 Abdominal aortic aneurysm, without rupture, unspecified: Secondary | ICD-10-CM

## 2016-02-29 DIAGNOSIS — Z95828 Presence of other vascular implants and grafts: Secondary | ICD-10-CM

## 2016-02-29 NOTE — Progress Notes (Signed)
    Post-operative EVAR   History of Present Illness  Danise MinaDavid G Lilja Sr. is a 82 y.o. male who presents post-op s/p EVAR (Date: 01/05/16).   Most recent CT (Date: 02/29/16) demonstrates: decreasing sac size, no comment on endoleak can be made due to lack of contrast due to CKD.  The patient has had no back or abdominal pain.  For VQI Use Only  PRE-ADM LIVING: Home  AMB STATUS: Wheelchair  Physical Examination  Vitals:   03/02/16 1059 03/02/16 1100  BP: (!) 146/87 (!) 148/83  Pulse: 72   Resp: 16   Temp: 98 F (36.7 C)     Vascular: Vessel Right Left  Aorta Non-palpable N/A  Femoral Palpable Palpable  Popliteal palpable Non-palpable  PT Palpable Faintly Palpable  DP Not Palpable Not Palpable   Gastrointestinal: soft, NTND, -G/R, - HSM, - masses, - CVAT B  Non-Invasive Vascular Imaging  CT Abd & pelvis (Date: 02/29/2016) Status post endovascular repair of infrarenal abdominal aortic aneurysm. Aneurysmal sac has maximum measured AP diameter of 7.2 cm which is decreased compared to prior exam. Due to the lack of intravenous contrast, which was not administered due to renal insufficiency, endoleaks cannot be excluded on the basis of this exam.  Sigmoid diverticulosis without inflammation.  Mild cholelithiasis.  Stable hepatic cysts.  Severe right renal atrophy.  Based on my review of this patient's CT w/o contrast, main endograft and limbs appears to be appropriately positioned with good seal proximally and distally, though endoleaks cannot be commented upon as no contrast is present   Medical Decision Making  Allene PyoDavid G Hilbun Sr. is a 82 y.o. male who presents s/p EVAR.  Pt is asymptomatic with stable sac size.  I discussed with the patient the importance of surveillance of the endograft.  The next endograft duplex will be scheduled for 6 months.  The next CT will be scheduled for 12 months.  The patient will follow up with us in 6 months with these  studies.  Thank you for allowing us to participate in this patient's care.  Leonides SakeBrian Philomena Buttermore, MD, FACS [facility] Office: [phone] Pager: [phone]

## 2016-03-02 ENCOUNTER — Ambulatory Visit (INDEPENDENT_AMBULATORY_CARE_PROVIDER_SITE_OTHER): Payer: Self-pay | Admitting: Vascular Surgery

## 2016-03-02 ENCOUNTER — Encounter: Payer: Self-pay | Admitting: Vascular Surgery

## 2016-03-02 VITALS — BP 148/83 | HR 72 | Temp 98.0°F | Resp 16 | Ht 69.0 in | Wt 162.0 lb

## 2016-03-02 DIAGNOSIS — I714 Abdominal aortic aneurysm, without rupture, unspecified: Secondary | ICD-10-CM

## 2016-03-05 DIAGNOSIS — H25813 Combined forms of age-related cataract, bilateral: Secondary | ICD-10-CM | POA: Diagnosis not present

## 2016-03-06 NOTE — Addendum Note (Signed)
Addended by: Burton ApleyPETTY, Terisa Belardo A on: 03/06/2016 03:52 PM   Modules accepted: Orders

## 2016-03-16 ENCOUNTER — Ambulatory Visit (INDEPENDENT_AMBULATORY_CARE_PROVIDER_SITE_OTHER): Payer: Medicare Other | Admitting: Internal Medicine

## 2016-03-16 ENCOUNTER — Encounter: Payer: Self-pay | Admitting: Internal Medicine

## 2016-03-16 VITALS — BP 142/72 | HR 71

## 2016-03-16 DIAGNOSIS — J302 Other seasonal allergic rhinitis: Secondary | ICD-10-CM | POA: Diagnosis not present

## 2016-03-16 DIAGNOSIS — J8410 Pulmonary fibrosis, unspecified: Secondary | ICD-10-CM | POA: Diagnosis not present

## 2016-03-16 DIAGNOSIS — J9611 Chronic respiratory failure with hypoxia: Secondary | ICD-10-CM

## 2016-03-16 MED ORDER — AZELASTINE-FLUTICASONE 137-50 MCG/ACT NA SUSP
1.0000 | Freq: Two times a day (BID) | NASAL | 11 refills | Status: AC
Start: 2016-03-16 — End: ?

## 2016-03-16 NOTE — Patient Instructions (Addendum)
Dymista one twice daily each nostril - fill the prescription if you think it hleps - if you like it but can't afford it we can call you in the generic equivalent    If you are satisfied with your treatment plan,  let your doctor know and he/she can either refill your medications or you can return here when your prescription runs out.     If in any way you are not 100% satisfied,  please tell us.  If 100% better, tell your friends!  Pulmonary follow up is as needed

## 2016-03-16 NOTE — Progress Notes (Signed)
Subjective:     Patient ID: Kenneth Gobble., male   DOB: Sep 25, 1934,  MRN: [id number]    Brief patient profile:  89 yowm quit smoking 1973 no symptoms with chronic back pain x 2016 slowed him down but not his breathing other than needing nasal sprays that really don't help  > MRI showed aneurysm and post op resp failure > referred to pulmonary clinic 02/17/2016 by Dr   Micheal Likens for ? [place]  Admission Date: 01/05/2016  Discharge Date: 01/06/16  Physician: Conrad Day, MD  Admission Diagnosis: Abdominal aortic aneurysm    HPI:   This is a 82 y.o. male who presents with chief complaint: back pain. This patient had recurrence of his previous back pain associated with DDD with radicular sx in his leg similar to previous episodes. The patient had a MRI completed at an outside facility which was read as possible contained rupture of AAA. Pt currently denies back or abdominal pain. The patient notes nohistory of embolic episodes from the AAA. The patient's risk factors for AAA included: age and sex and prior smoker. The patient does notsmoke cigarettes.    Admission Date: 01/05/2016  Discharge Date: 01/06/16  Hospital Course:  The patient was admitted to the hospital and taken to the operating room on 01/05/2016 and underwent: 1. Bilateral common femoral artery cannulation under ultrasound guidance 2. "Preclose" repair of bilateral common femoral artery  3. Placement of catheter in aorta x 2 4. Aortogram 5. Placement of bifurcated aortic endograft (75m x 14 mm x 14cm)  7. Placement of right iliac limb (173mx 13.5cm) 8. Placement of right iliac limb extension (1842m 13.5cm) 9. Placement of left iliac limb extension (27 mm x 12 cm) Radiologic S&I    The pt tolerated the procedure well and was transported to the PACU in good condition.   He did have a grade I hematoma left groin in the recovery room by report from the nurse.  The pt continued to have doppler  signals in bilateral feet.    By POD 1, he did have some ecchymosis of the left groin, however, the hematoma was stable.  He did have acute surgical blood loss anemia and was tolerating this.  He has baseline renal dysfunction and this remained stable postoperatively.  His foley was removed and he was able to void about 75cc.  The pt was going to receive an I&O cath, however, the pt refused.  He is discharged home with instructions to return in 4 hours if he has not voided.     The remainder of the hospital course consisted of increasing mobilization and increasing intake of solids without difficulty.  CBC Labs(Brief)          Component Value Date/Time   WBC 8.1 01/06/2016 0315   RBC 4.18 (L) 01/06/2016 0315   HGB 13.5 01/06/2016 0315   HCT 41.2 01/06/2016 0315   PLT 100 (L) 01/06/2016 0315   MCV 98.6 01/06/2016 0315   MCH 32.3 01/06/2016 0315   MCHC 32.8 01/06/2016 0315   RDW 15.1 01/06/2016 0315   LYMPHSABS 1.5 12/28/2015 0316   MONOABS 0.6 12/28/2015 0316   EOSABS 0.6 12/28/2015 0316   BASOSABS 0.1 12/28/2015 0316      BMET Labs(Brief)          Component Value Date/Time   NA 137 01/06/2016 0315   K 4.5 01/06/2016 0315   CL 109 01/06/2016 0315   CO2 21 (L) 01/06/2016 0315   GLUCOSE  113 (H) 01/06/2016 0315   BUN 24 (H) 01/06/2016 0315   CREATININE 2.01 (H) 01/06/2016 0315   CALCIUM 8.5 (L) 01/06/2016 0315   GFRNONAA 30 (L) 01/06/2016 0315   GFRAA 35 (L) 01/06/2016 0315             Discharge Diagnosis:  Abdominal aortic aneurysm I71.4  Secondary Diagnosis:     Patient Active Problem List   Diagnosis Date Noted  . S/P AAA repair using bifurcation graft 01/05/2016  . Acute right-sided low back pain with sciatica   . 3-vessel CAD   . Pulmonary hypertension   . DOE (dyspnea on exertion)   . Muscle weakness (generalized)   . Abdominal aortic aneurysm (AAA) without rupture ([place]) 12/23/2015  . Stage 4 chronic  kidney disease ([place]) 06/10/2015       Past Medical History:  Diagnosis Date  . Abdominal aortic aneurysm (AAA) ([place])   . Arteriosclerotic cardiovascular disease   . Chronic kidney disease   . Chronic kidney disease, stage IV (severe) ([place])   . Coronary artery disease   . DJD (degenerative joint disease)   . Dyspnea    on O2 2 L 24/7  . Hypertension   . Hypertensive renal disease   . Pneumonia    as an child  . Renal tubular acidosis   . Secondary hyperparathyroidism ([place])        Medication List    TAKE these medications   acetaminophen 325 MG tablet Commonly known as:  TYLENOL Take 2 tablets (650 mg total) by mouth every 4 (four) hours as needed for headache or mild pain.  allopurinol 300 MG tablet Commonly known as:  ZYLOPRIM Take 300 mg by mouth daily.  aspirin EC 81 MG tablet Take 81 mg by mouth daily.  calcium acetate 667 MG capsule Commonly known as:  PHOSLO Take 667 mg by mouth 2 (two) times daily with a meal.  fluticasone 50 MCG/ACT nasal spray Commonly known as:  FLONASE Place 1 spray into both nostrils daily.  loratadine 10 MG tablet Commonly known as:  CLARITIN Take 10 mg by mouth at bedtime.  metoprolol succinate 50 MG 24 hr tablet Commonly known as:  TOPROL-XL Take 50 mg by mouth at bedtime.  mometasone-formoterol 100-5 MCG/ACT Aero Commonly known as:  DULERA Inhale 2 puffs into the lungs 2 (two) times daily. What changed:  when to take this  nitroGLYCERIN 0.4 MG SL tablet Commonly known as:  NITROSTAT Place 1 tablet (0.4 mg total) under the tongue every 5 (five) minutes as needed for chest pain.  oxyCODONE 5 MG immediate release tablet Commonly known as:  ROXICODONE Take 1 tablet (5 mg total) by mouth every 6 (six) hours as needed for severe pain.  RENA-VITE RX 1 MG Tabs Take 1 mg by mouth daily.  rosuvastatin 20 MG tablet Commonly known as:  CRESTOR Take 20 mg by mouth daily at 12 noon.  sodium bicarbonate 650 MG  tablet Take 1,300 mg by mouth 2 (two) times daily.  VENTOLIN HFA 108 (90 Base) MCG/ACT inhaler Generic drug:  albuterol Inhale 1-2 puffs into the lungs every 4 (four) hours as needed for wheezing.      Prescriptions given: Roxicodone #10 No Refill  Instructions: 1.  No heavy lifting x 4 weeks 2.  No driving x 2 weeks and while taking pain medication 3.  Shower daily starting 01/07/16  Disposition: home  Patient's condition: is Good.  He is on home O2.  Addendum  I agree with my PA's discharge summary.  This patient with high risk cardiopulmonary status underwent EVAR for a large AAA >8 cm.  The EVAR was completed with spinal anesthesia and MAC.  His post-operative course was unremarkable except for minor urinary retention that never became symptomatic.  The patient will follow up in 4 weeks with a CTA abd/pelvis.      02/17/2016 1st [facility] office visit/ [name]  Re post op resp failure  Chief Complaint  Patient presents with  . Pulmonary Consult    Referred by Dr. Micheal Likens. Pt states discharged from hospital on o2 after having surgery for AAA.  He is having SOB walking approx 10 ft, but relates this to having back pain.   nasal drainage 24/7 x age 77 nothing ever helps including ent/ allergy eval per pt only better lived at [facility] x 3 years Placed on [place] 100 2 each am  at d/c empirically but doesn't think this helps breathing which doesn't so much limit him  as does weak legs and back pain and no problem at hs on 2lpm  rec Ok to leave off oxygen at rest but goal is to maintain over 90% at all times       03/16/2016  f/u ov/[name] re: chronic resp failure s/p ALI   On prn saba only/ not using/  Only  rx = 02 2lpm 24/7  Chief Complaint  Patient presents with  . Follow-up    f/u dyspnea, on 2L of O2, doing well, no complaints today    Still not very active due to back pain/ w/c to bed /chair transfers with assistance but really not walking Main  concern is nose running watery 24/7 for years no better with clariton or flonase  No obvious day to day or daytime variability or assoc excess/ purulent sputum or mucus plugs or hemoptysis or cp or chest tightness, subjective wheeze or overt  hb symptoms. No unusual exp hx or h/o childhood pna/ asthma or knowledge of premature birth.  Sleeping ok without nocturnal  or early am exacerbation  of respiratory  c/o's or need for noct saba. Also denies any obvious fluctuation of symptoms with weather or environmental changes or other aggravating or alleviating factors except as outlined above   Current Medications, Allergies, Complete Past Medical History, Past Surgical History, Family History, and Social History were reviewed in [employer] record.  ROS  The following are not active complaints unless bolded sore throat, dysphagia, dental problems, itching, sneezing,  nasal congestion or excess watery d/c/ purulent secretions, ear ache,   fever, chills, sweats, unintended wt loss, classically pleuritic or exertional cp,  orthopnea pnd or leg swelling, presyncope, palpitations, abdominal pain, anorexia, nausea, vomiting, diarrhea  or change in bowel or bladder habits, change in stools or urine, dysuria,hematuria,  rash, arthralgias, visual complaints, headache, numbness, weakness or ataxia or problems with walking or coordination,  change in mood/affect or memory.                  Objective:   Physical Exam    w/c bound/ chronically ill nad  03/16/2016         Could not stand for wt   02/17/16 162 lb (73.5 kg)  01/06/16 160 lb 4.4 oz (72.7 kg)  01/04/16 172 lb (78 kg)    Vital signs reviewed  - - Note on arrival 02 sats  97% on 2lpm     HEENT: nl  turbinates, and  oropharynx. Nl external ear canals without cough reflex - full dentures    NECK :  without JVD/Nodes/TM/ nl carotid upstrokes bilaterally   LUNGS: no acc muscle use,  Nl contour chest with classic coarse BV  changes bilaterally worse in bases    CV:  RRR  no s3 or murmur or increase in P2, nad no edema   ABD:  soft and nontender with nl inspiratory excursion No bruits or organomegaly appreciated, bowel sounds nl  MS:   ext warm without deformities, calf tenderness, cyanosis or clubbing No obvious joint restrictions / some muscle wasting present /sym   SKIN: warm and dry without lesions    NEURO:  alert, approp, nl sensorium with  no motor or cerebellar deficits apparent.     CXR PA and Lateral:   02/17/2016 :    I personally reviewed images and agree with radiology impression as follows:    Stable chronic reticular interstitial prominence and peripheral fibrotic changes. No definite superimposed infiltrate or pulmonary Edema.   Labs  reviewed:      Chemistry      Component Value Date/Time   NA 139 02/17/2016 1207   K 4.1 02/17/2016 1207   CL 99 02/17/2016 1207   CO2 32 02/17/2016 1207   BUN 45 (H) 02/17/2016 1207   CREATININE 2.49 (H) 02/17/2016 1207      Component Value Date/Time   CALCIUM 10.4 02/17/2016 1207   ALKPHOS 58 12/28/2015 0316   AST 19 12/28/2015 0316   ALT 8 (L) 12/28/2015 0316   BILITOT 0.8 12/28/2015 0316        Lab Results  Component Value Date   WBC 11.3 (H) 02/17/2016   HGB 17.1 (H) 02/17/2016   HCT 51.4 02/17/2016   MCV 98.8 02/17/2016   PLT 133.0 (L) 02/17/2016        Lab Results  Component Value Date   PROBNP 49.0 02/17/2016       Lab Results  Component Value Date   ESRSEDRATE 10 02/17/2016   ESRSEDRATE 2 12/26/2015          Assessment:

## 2016-03-17 ENCOUNTER — Encounter: Payer: Self-pay | Admitting: Internal Medicine

## 2016-03-17 DIAGNOSIS — J310 Chronic rhinitis: Secondary | ICD-10-CM | POA: Insufficient documentation

## 2016-03-17 NOTE — Assessment & Plan Note (Signed)
Trial of dymista one bid x 2 samples   If not effective consider atrovent NS

## 2016-03-17 NOTE — Assessment & Plan Note (Addendum)
Spirometry 02/17/2016  FEV1 1.75 (63%)  Ratio 68  s rx prior and with very flat exp f/v curve   Still appears to be 02 dep s/p ALI s significant copd clinically so only rx is 02 and work on mobilization and time  I had an extended discussion with the patient reviewing all relevant studies completed to date and  lasting 15 to 20 minutes of a 25 minute visit    Each maintenance medication was reviewed in detail including most importantly the difference between maintenance and prns and under what circumstances the prns are to be triggered using an action plan format that is not reflected in the computer generated alphabetically organized AVS.    Please see AVS for specific instructions unique to this visit that I personally wrote and verbalized to the the pt in detail and then reviewed with pt  by my nurse highlighting any  changes in therapy recommended at today's visit to their plan of care.

## 2016-03-17 NOTE — Assessment & Plan Note (Signed)
Started 02 post hosp for AAA 12/09/18/17   As of 03/16/2016 rx = 2lpm but ok to titrate daytime to maintain sats > 90%

## 2016-05-15 DIAGNOSIS — Z9889 Other specified postprocedural states: Secondary | ICD-10-CM | POA: Diagnosis not present

## 2016-05-15 DIAGNOSIS — N2589 Other disorders resulting from impaired renal tubular function: Secondary | ICD-10-CM | POA: Diagnosis not present

## 2016-05-15 DIAGNOSIS — M109 Gout, unspecified: Secondary | ICD-10-CM | POA: Diagnosis not present

## 2016-05-15 DIAGNOSIS — N2581 Secondary hyperparathyroidism of renal origin: Secondary | ICD-10-CM | POA: Diagnosis not present

## 2016-05-15 DIAGNOSIS — D631 Anemia in chronic kidney disease: Secondary | ICD-10-CM | POA: Diagnosis not present

## 2016-05-15 DIAGNOSIS — I770 Arteriovenous fistula, acquired: Secondary | ICD-10-CM | POA: Diagnosis not present

## 2016-05-15 DIAGNOSIS — I2510 Atherosclerotic heart disease of native coronary artery without angina pectoris: Secondary | ICD-10-CM | POA: Diagnosis not present

## 2016-05-15 DIAGNOSIS — I129 Hypertensive chronic kidney disease with stage 1 through stage 4 chronic kidney disease, or unspecified chronic kidney disease: Secondary | ICD-10-CM | POA: Diagnosis not present

## 2016-05-15 DIAGNOSIS — N184 Chronic kidney disease, stage 4 (severe): Secondary | ICD-10-CM | POA: Diagnosis not present

## 2016-05-17 DIAGNOSIS — I714 Abdominal aortic aneurysm, without rupture: Secondary | ICD-10-CM | POA: Diagnosis not present

## 2016-05-17 DIAGNOSIS — N189 Chronic kidney disease, unspecified: Secondary | ICD-10-CM | POA: Diagnosis not present

## 2016-05-17 DIAGNOSIS — I272 Pulmonary hypertension, unspecified: Secondary | ICD-10-CM | POA: Diagnosis not present

## 2016-05-17 DIAGNOSIS — M109 Gout, unspecified: Secondary | ICD-10-CM | POA: Diagnosis not present

## 2016-05-17 DIAGNOSIS — M1990 Unspecified osteoarthritis, unspecified site: Secondary | ICD-10-CM | POA: Diagnosis not present

## 2016-05-17 DIAGNOSIS — J449 Chronic obstructive pulmonary disease, unspecified: Secondary | ICD-10-CM | POA: Diagnosis not present

## 2016-05-17 DIAGNOSIS — I2510 Atherosclerotic heart disease of native coronary artery without angina pectoris: Secondary | ICD-10-CM | POA: Diagnosis not present

## 2016-05-17 DIAGNOSIS — G4734 Idiopathic sleep related nonobstructive alveolar hypoventilation: Secondary | ICD-10-CM | POA: Diagnosis not present

## 2016-05-17 DIAGNOSIS — E7800 Pure hypercholesterolemia, unspecified: Secondary | ICD-10-CM | POA: Diagnosis not present

## 2016-05-17 DIAGNOSIS — I10 Essential (primary) hypertension: Secondary | ICD-10-CM | POA: Diagnosis not present

## 2016-09-05 DIAGNOSIS — J309 Allergic rhinitis, unspecified: Secondary | ICD-10-CM | POA: Diagnosis not present

## 2016-09-05 DIAGNOSIS — J449 Chronic obstructive pulmonary disease, unspecified: Secondary | ICD-10-CM | POA: Diagnosis not present

## 2016-09-05 DIAGNOSIS — M109 Gout, unspecified: Secondary | ICD-10-CM | POA: Diagnosis not present

## 2016-09-05 DIAGNOSIS — Z79899 Other long term (current) drug therapy: Secondary | ICD-10-CM | POA: Diagnosis not present

## 2016-09-05 DIAGNOSIS — I2510 Atherosclerotic heart disease of native coronary artery without angina pectoris: Secondary | ICD-10-CM | POA: Diagnosis not present

## 2016-09-05 DIAGNOSIS — G4734 Idiopathic sleep related nonobstructive alveolar hypoventilation: Secondary | ICD-10-CM | POA: Diagnosis not present

## 2016-09-05 DIAGNOSIS — E785 Hyperlipidemia, unspecified: Secondary | ICD-10-CM | POA: Diagnosis not present

## 2016-09-05 DIAGNOSIS — N189 Chronic kidney disease, unspecified: Secondary | ICD-10-CM | POA: Diagnosis not present

## 2016-09-05 DIAGNOSIS — I10 Essential (primary) hypertension: Secondary | ICD-10-CM | POA: Diagnosis not present

## 2016-09-05 DIAGNOSIS — Z9889 Other specified postprocedural states: Secondary | ICD-10-CM | POA: Diagnosis not present

## 2016-09-11 DIAGNOSIS — D631 Anemia in chronic kidney disease: Secondary | ICD-10-CM | POA: Diagnosis not present

## 2016-09-11 DIAGNOSIS — Z9889 Other specified postprocedural states: Secondary | ICD-10-CM | POA: Diagnosis not present

## 2016-09-11 DIAGNOSIS — N184 Chronic kidney disease, stage 4 (severe): Secondary | ICD-10-CM | POA: Diagnosis not present

## 2016-09-11 DIAGNOSIS — I129 Hypertensive chronic kidney disease with stage 1 through stage 4 chronic kidney disease, or unspecified chronic kidney disease: Secondary | ICD-10-CM | POA: Diagnosis not present

## 2016-09-11 DIAGNOSIS — I770 Arteriovenous fistula, acquired: Secondary | ICD-10-CM | POA: Diagnosis not present

## 2016-09-11 DIAGNOSIS — M109 Gout, unspecified: Secondary | ICD-10-CM | POA: Diagnosis not present

## 2016-09-11 DIAGNOSIS — N2589 Other disorders resulting from impaired renal tubular function: Secondary | ICD-10-CM | POA: Diagnosis not present

## 2016-09-11 DIAGNOSIS — M5030 Other cervical disc degeneration, unspecified cervical region: Secondary | ICD-10-CM | POA: Diagnosis not present

## 2016-09-11 DIAGNOSIS — N2581 Secondary hyperparathyroidism of renal origin: Secondary | ICD-10-CM | POA: Diagnosis not present

## 2016-09-18 ENCOUNTER — Encounter: Payer: Self-pay | Admitting: Vascular Surgery

## 2016-09-21 NOTE — Progress Notes (Signed)
Established EVAR   History of Present Illness   Kenneth FISCHEL Sr. is a 82 y.o. (Dec 09, 1934) male who presents for routine follow up s/p EVAR (Date: 01/05/16).  Most recent EVAR duplex (Date: 09/28/2016) demonstrates: no endoleak and smaller sac size.  Most recent CT (Date: 02/29/16) demonstrates: no endoleak as no contrast and decreasing sac size.  The patient has not had back or abdominal pain.    Right arm is swollen.  Pt remains in CKD and not ESRD  The patient's PMH, PSH, SH, and FamHx are unchanged from 01/05/16.  Current Outpatient Prescriptions  Medication Sig Dispense Refill  . acetaminophen (TYLENOL) 325 MG tablet Take 2 tablets (650 mg total) by mouth every 4 (four) hours as needed for headache or mild pain. 30 tablet 0  . allopurinol (ZYLOPRIM) 300 MG tablet Take 300 mg by mouth daily.    Marland Kitchen aspirin EC 81 MG tablet Take 81 mg by mouth daily.    . Azelastine-Fluticasone (DYMISTA) 137-50 MCG/ACT SUSP Place 1 puff into the nose 2 (two) times daily. 23 g 11  . B Complex-C-Folic Acid (RENA-VITE RX) 1 MG TABS Take 1 mg by mouth daily.    . calcium acetate (PHOSLO) 667 MG capsule Take 667 mg by mouth 2 (two) times daily with a meal.    . loratadine (CLARITIN) 10 MG tablet Take 10 mg by mouth at bedtime.    . metoprolol succinate (TOPROL-XL) 50 MG 24 hr tablet Take 50 mg by mouth at bedtime.     . nitroGLYCERIN (NITROSTAT) 0.4 MG SL tablet Place 1 tablet (0.4 mg total) under the tongue every 5 (five) minutes as needed for chest pain. 10 tablet 0  . OXYGEN 2lpm 24/7 AHC    . sodium bicarbonate 650 MG tablet Take 1,300 mg by mouth 2 (two) times daily.    . VENTOLIN HFA 108 (90 Base) MCG/ACT inhaler Inhale 1-2 puffs into the lungs every 4 (four) hours as needed for wheezing.      No current facility-administered medications for this visit.     On ROS today: right arm swelling, no back or abd pain   Physical Examination   Vitals:   09/28/16 0942  BP: (!) 142/87  Pulse: 70    Temp: (!) 97 F (36.1 C)  Weight: 150 lb (68 kg)  Height: 5\' 7"  (1.702 m)   Body mass index is 23.49 kg/m.  General Alert, O x 3, elderly, ill appearing, fragile appearing  Pulmonary Sym exp, good B air movt, rales on B, prominent R chest veins  Cardiac RRR, Nl S1, S2, no Murmurs, No rubs, No S3,S4  Vascular Vessel Right Left  Radial Faintly palpable Palpable  Brachial Palpable Palpable  Carotid Palpable, No Bruit Palpable, No Bruit  Aorta Not palpable N/A  Femoral Not palpable due to positioning in wheelchair Not palpable due to positioning in wheelchair  Popliteal Not palpable Not palpable  PT Faintly palpable Faintly palpable  DP Faintly palpable Faintly palpable    Gastro- intestinal soft, non-distended, non-tender to palpation, No guarding or rebound, no HSM, no masses, no CVAT B, No palpable prominent aortic pulse,    Musculo- skeletal M/S 5/5 throughout  , Extremities without ischemic changes  , No edema present, No visible varicosities , No Lipodermatosclerosis present, R arm swollen with pulsatile R BC AVF  Neurologic Pain and light touch intact in extremities , Motor exam as listed above    Non-Invasive Vascular Imaging   EVAR Duplex (09/28/2016 )  AAA sac size: 6.9 cm x 7.1 cm (smaller)  no endoleak detected   Medical Decision Making   Kenneth NIGH Sr. is a 82 y.o. male who presents s/p EVAR.  Pt is asymptomatic with smaller sac size, likely R central venous stenosis vs occlusion, chronic kidney disease stage 3-4    In regards to this R BC AVF and central venous stenosis vs occlusion, I do not recommend: R arm fistulogram and possible intervention.  The contrast load might potentiate renal failure  Additionally, it has been my experience the AVF that require endovascular intervention prior to any cannulation usually do not have significant long-term patency.  Subsequently, any endovascular procedure would have limited risk-to-benefit value.  I would not  be surprised the R BC AVF occludes at some point.  At this point, this patient's overall functional status appears significantly worse than when I last saw him.  I would probably hold off on any additional procedures at this point, including additional access placement.  I discussed with the patient the importance of surveillance of the endograft.  The next endograft duplex will be scheduled for 6 months.  Pt is not a candidate for CTA given his CKD  The patient will follow up with [name] in 6 months with these studies.  Thank you for allowing [name] to participate in this patient's care.   [name], MD, FACS [facility] Office: [phone] Pager: [phone]

## 2016-09-28 ENCOUNTER — Ambulatory Visit (HOSPITAL_COMMUNITY)
Admission: RE | Admit: 2016-09-28 | Discharge: 2016-09-28 | Disposition: A | Discharge status: Home / Self Care | Payer: Medicare Other | Source: Ambulatory Visit | Attending: Vascular Surgery | Admitting: Vascular Surgery

## 2016-09-28 ENCOUNTER — Ambulatory Visit (INDEPENDENT_AMBULATORY_CARE_PROVIDER_SITE_OTHER): Payer: Medicare Other | Admitting: Vascular Surgery

## 2016-09-28 ENCOUNTER — Encounter: Payer: Self-pay | Admitting: Vascular Surgery

## 2016-09-28 VITALS — BP 142/87 | HR 70 | Temp 97.0°F | Ht 67.0 in | Wt 150.0 lb

## 2016-09-28 DIAGNOSIS — I714 Abdominal aortic aneurysm, without rupture, unspecified: Secondary | ICD-10-CM

## 2016-09-28 DIAGNOSIS — N184 Chronic kidney disease, stage 4 (severe): Secondary | ICD-10-CM | POA: Diagnosis not present

## 2016-10-09 NOTE — Addendum Note (Signed)
Addended by: Burton ApleyPETTY, Jonta Gastineau A on: 10/09/2016 03:37 PM   Modules accepted: Orders

## 2017-01-07 DIAGNOSIS — J309 Allergic rhinitis, unspecified: Secondary | ICD-10-CM | POA: Diagnosis not present

## 2017-01-07 DIAGNOSIS — Z9181 History of falling: Secondary | ICD-10-CM | POA: Diagnosis not present

## 2017-01-07 DIAGNOSIS — M109 Gout, unspecified: Secondary | ICD-10-CM | POA: Diagnosis not present

## 2017-01-07 DIAGNOSIS — Z9889 Other specified postprocedural states: Secondary | ICD-10-CM | POA: Diagnosis not present

## 2017-01-07 DIAGNOSIS — Z1331 Encounter for screening for depression: Secondary | ICD-10-CM | POA: Diagnosis not present

## 2017-01-07 DIAGNOSIS — N189 Chronic kidney disease, unspecified: Secondary | ICD-10-CM | POA: Diagnosis not present

## 2017-01-07 DIAGNOSIS — I10 Essential (primary) hypertension: Secondary | ICD-10-CM | POA: Diagnosis not present

## 2017-01-07 DIAGNOSIS — I2510 Atherosclerotic heart disease of native coronary artery without angina pectoris: Secondary | ICD-10-CM | POA: Diagnosis not present

## 2017-01-07 DIAGNOSIS — L309 Dermatitis, unspecified: Secondary | ICD-10-CM | POA: Diagnosis not present

## 2017-01-07 DIAGNOSIS — I272 Pulmonary hypertension, unspecified: Secondary | ICD-10-CM | POA: Diagnosis not present

## 2017-02-06 DIAGNOSIS — M109 Gout, unspecified: Secondary | ICD-10-CM | POA: Diagnosis not present

## 2017-02-06 DIAGNOSIS — N184 Chronic kidney disease, stage 4 (severe): Secondary | ICD-10-CM | POA: Diagnosis not present

## 2017-02-06 DIAGNOSIS — I129 Hypertensive chronic kidney disease with stage 1 through stage 4 chronic kidney disease, or unspecified chronic kidney disease: Secondary | ICD-10-CM | POA: Diagnosis not present

## 2017-02-06 DIAGNOSIS — I770 Arteriovenous fistula, acquired: Secondary | ICD-10-CM | POA: Diagnosis not present

## 2017-02-06 DIAGNOSIS — M5030 Other cervical disc degeneration, unspecified cervical region: Secondary | ICD-10-CM | POA: Diagnosis not present

## 2017-02-06 DIAGNOSIS — D631 Anemia in chronic kidney disease: Secondary | ICD-10-CM | POA: Diagnosis not present

## 2017-02-06 DIAGNOSIS — Z9889 Other specified postprocedural states: Secondary | ICD-10-CM | POA: Diagnosis not present

## 2017-02-06 DIAGNOSIS — N2589 Other disorders resulting from impaired renal tubular function: Secondary | ICD-10-CM | POA: Diagnosis not present

## 2017-02-06 DIAGNOSIS — N2581 Secondary hyperparathyroidism of renal origin: Secondary | ICD-10-CM | POA: Diagnosis not present

## 2017-03-29 NOTE — Progress Notes (Deleted)
Established EVAR   History of Present Illness   Kenneth PyoDavid G Azucena Sr. is a 83 y.o. (07/25/1934) male who presents for routine follow up s/p EVAR (Date: 09/28/16).  Most recent EVAR duplex (Date: 09/28/16) demonstrates: no endoleak and decreasing sac size.  Most recent CT Abd/pelvis w/o contrast (Date: 02/29/16) demonstrates: decreasing sac size, endoleak cannot be detected on this contrast.  The patient has *** had back or abdominal pain.  The patient's PMH, PSH, SH, and FamHx are unchanged from 09/28/16.  Current Outpatient Medications  Medication Sig Dispense Refill  . acetaminophen (TYLENOL) 325 MG tablet Take 2 tablets (650 mg total) by mouth every 4 (four) hours as needed for headache or mild pain. 30 tablet 0  . allopurinol (ZYLOPRIM) 300 MG tablet Take 300 mg by mouth daily.    Marland Kitchen. aspirin EC 81 MG tablet Take 81 mg by mouth daily.    . Azelastine-Fluticasone (DYMISTA) 137-50 MCG/ACT SUSP Place 1 puff into the nose 2 (two) times daily. 23 g 11  . B Complex-C-Folic Acid (RENA-VITE RX) 1 MG TABS Take 1 mg by mouth daily.    . calcium acetate (PHOSLO) 667 MG capsule Take 667 mg by mouth 2 (two) times daily with a meal.    . loratadine (CLARITIN) 10 MG tablet Take 10 mg by mouth at bedtime.    . metoprolol succinate (TOPROL-XL) 50 MG 24 hr tablet Take 50 mg by mouth at bedtime.     . nitroGLYCERIN (NITROSTAT) 0.4 MG SL tablet Place 1 tablet (0.4 mg total) under the tongue every 5 (five) minutes as needed for chest pain. 10 tablet 0  . OXYGEN 2lpm 24/7 AHC    . sodium bicarbonate 650 MG tablet Take 1,300 mg by mouth 2 (two) times daily.    . VENTOLIN HFA 108 (90 Base) MCG/ACT inhaler Inhale 1-2 puffs into the lungs every 4 (four) hours as needed for wheezing.      No current facility-administered medications for this visit.     On ROS today: ***, ***   Physical Examination  ***There were no vitals filed for this visit. ***There is no height or weight on file to calculate  BMI.  General {LOC:19197::"Somulent","Alert"}, {Orientation:19197::"Confused","O x 3"}, {Weight:19197::"Obese","Cachectic","WD"}, {General state of health:19197::"Ill appearing","Elderly","NAD"}  Pulmonary {Chest wall:19197::"Asx chest movement","Sym exp"}, {Air movt:19197::"Decreased *** air movt","good B air movt"}, {BS:19197::"rales on ***","rhonchi on ***","wheezing on ***","CTA B"}  Cardiac {Rhythm:19197::"Irregularly, irregular rate and rhythm","RRR, Nl S1, S2"}, {Murmur:19197::"Murmur present: ***","no Murmurs"}, {Rubs:19197::"Rub present: ***","No rubs"}, {Gallop:19197::"Gallop present: ***","No S3,S4"}  Vascular Vessel Right Left  Radial {Palpable:19197::"Not palpable","Faintly palpable","Palpable"} {Palpable:19197::"Not palpable","Faintly palpable","Palpable"}  Brachial {Palpable:19197::"Not palpable","Faintly palpable","Palpable"} {Palpable:19197::"Not palpable","Faintly palpable","Palpable"}  Carotid Palpable, {Bruit:19197::"Bruit present","No Bruit"} Palpable, {Bruit:19197::"Bruit present","No Bruit"}  Aorta Not palpable N/A  Femoral {Palpable:19197::"Not palpable","Faintly palpable","Palpable"} {Palpable:19197::"Not palpable","Faintly palpable","Palpable"}  Popliteal {Palpable:19197::"Prominently palpable","Not palpable"} {Palpable:19197::"Prominently palpable","Not palpable"}  PT {Palpable:19197::"Not palpable","Faintly palpable","Palpable"} {Palpable:19197::"Not palpable","Faintly palpable","Palpable"}  DP {Palpable:19197::"Not palpable","Faintly palpable","Palpable"} {Palpable:19197::"Not palpable","Faintly palpable","Palpable"}    Gastro- intestinal soft, {Distension:19197::"distended","non-distended"}, {TTP:19197::"TTP in *** quadrant","appropriate tenderness to palpation","non-tender to palpation"}, {Guarding:19197::"Guarding and rebound in *** quadrant","No guarding or rebound"}, {HSM:19197::"HSM present","no HSM"}, {Masses:19197::"Mass present: ***","no masses"},  {Flank:19197::"CVAT on ***","Flank bruit present on ***","no CVAT B"}, {AAA:19197::"Palpable prominent aortic pulse","No palpable prominent aortic pulse"}, {Surgical incision:19197::"Surg incisions: ***","Surgical incisions well healed"," "}  Musculo- skeletal M/S 5/5 throughout {MS:19197::"except ***"," "}, Extremities without ischemic changes {MS:19197::"except ***"," "}, {Edema:19197::"Pitting edema present: ***","Non-pitting edema present: ***","No edema present"}, {Varicosities:19197::"Varicosities present: ***","No visible varicosities "}, {LDS:19197::"Lipodermatosclerosis present: ***","No Lipodermatosclerosis present"}  Neurologic Pain and light  touch intact in extremities{CN:19197::" except for decreased sensation in ***"," "}, Motor exam as listed above    Non-Invasive Vascular Imaging   EVAR Duplex (***)  AAA sac size: *** cm x *** cm  *** endoleak detected  Radiology   CTA Abd/Pelvis Duplex (***)  AAA sac size: *** cm x *** cm  *** endoleak detected   Medical Decision Making   Kenneth DEASON Sr. is a 83 y.o. male who presents s/p EVAR.  Pt is asymptomatic with decreasing sac size.   I discussed with the patient the importance of surveillance of the endograft.  The next endograft duplex will be scheduled for *** months.  The next CTA will be scheduled for *** months.  The patient will follow up with [place] in *** months with these studies.  Thank you for allowing [place] to participate in this patient's care.   [name], MD, FACS [facility] Office: [phone] Pager: [phone]

## 2017-04-03 ENCOUNTER — Other Ambulatory Visit (HOSPITAL_COMMUNITY): Payer: Medicare Other

## 2017-04-03 ENCOUNTER — Ambulatory Visit: Payer: Medicare Other | Admitting: Vascular Surgery

## 2017-04-08 DIAGNOSIS — G4734 Idiopathic sleep related nonobstructive alveolar hypoventilation: Secondary | ICD-10-CM | POA: Diagnosis not present

## 2017-04-08 DIAGNOSIS — J309 Allergic rhinitis, unspecified: Secondary | ICD-10-CM | POA: Diagnosis not present

## 2017-04-08 DIAGNOSIS — J449 Chronic obstructive pulmonary disease, unspecified: Secondary | ICD-10-CM | POA: Diagnosis not present

## 2017-04-08 DIAGNOSIS — Z6823 Body mass index (BMI) 23.0-23.9, adult: Secondary | ICD-10-CM | POA: Diagnosis not present

## 2017-04-08 DIAGNOSIS — N189 Chronic kidney disease, unspecified: Secondary | ICD-10-CM | POA: Diagnosis not present

## 2017-04-08 DIAGNOSIS — I2510 Atherosclerotic heart disease of native coronary artery without angina pectoris: Secondary | ICD-10-CM | POA: Diagnosis not present

## 2017-04-08 DIAGNOSIS — I272 Pulmonary hypertension, unspecified: Secondary | ICD-10-CM | POA: Diagnosis not present

## 2017-04-08 DIAGNOSIS — M109 Gout, unspecified: Secondary | ICD-10-CM | POA: Diagnosis not present

## 2017-04-08 DIAGNOSIS — I509 Heart failure, unspecified: Secondary | ICD-10-CM | POA: Diagnosis not present

## 2017-04-08 DIAGNOSIS — I10 Essential (primary) hypertension: Secondary | ICD-10-CM | POA: Diagnosis not present

## 2017-04-08 DIAGNOSIS — Z9889 Other specified postprocedural states: Secondary | ICD-10-CM | POA: Diagnosis not present

## 2017-05-13 NOTE — Progress Notes (Signed)
Established EVAR   History of Present Illness   Kenneth PyoDavid G Gora Sr. is a 83 y.o. (09/19/1934) male who presents cc: right arm swelling.  Prior EVAR was completed on 01/05/16.  Most recent EVAR duplex (Date: 09/28/16) demonstrates: no endoleak and decreasing sac size.  Most recent CTA (Date: 02/29/16) demonstrates: no endoleak and decreasing sac size.  No further CTA was planned due to patient's worsening CKD.  The patient has had no new back or abdominal pain.  In regard to the right arm, he has had slow onset of swelling in that right arm since his brachiocephalic arteriovenous fistula.  The swelling is stable and does not interfere with ADLs.  Pt is current wheelchair bound and on oxygen.  The patient's PMH, PSH, SH, and FamHx were reviewed on 05/15/17 are unchanged from 09/28/16.  Current Outpatient Medications  Medication Sig Dispense Refill  . acetaminophen (TYLENOL) 325 MG tablet Take 2 tablets (650 mg total) by mouth every 4 (four) hours as needed for headache or mild pain. 30 tablet 0  . allopurinol (ZYLOPRIM) 300 MG tablet Take 300 mg by mouth daily.    Marland Kitchen. aspirin EC 81 MG tablet Take 81 mg by mouth daily.    . Azelastine-Fluticasone (DYMISTA) 137-50 MCG/ACT SUSP Place 1 puff into the nose 2 (two) times daily. 23 g 11  . B Complex-C-Folic Acid (RENA-VITE RX) 1 MG TABS Take 1 mg by mouth daily.    . calcium acetate (PHOSLO) 667 MG capsule Take 667 mg by mouth 2 (two) times daily with a meal.    . loratadine (CLARITIN) 10 MG tablet Take 10 mg by mouth at bedtime.    . metoprolol succinate (TOPROL-XL) 50 MG 24 hr tablet Take 50 mg by mouth at bedtime.     . nitroGLYCERIN (NITROSTAT) 0.4 MG SL tablet Place 1 tablet (0.4 mg total) under the tongue every 5 (five) minutes as needed for chest pain. 10 tablet 0  . OXYGEN 2lpm 24/7 AHC    . sodium bicarbonate 650 MG tablet Take 1,300 mg by mouth 2 (two) times daily.    . VENTOLIN HFA 108 (90 Base) MCG/ACT inhaler Inhale 1-2 puffs into the  lungs every 4 (four) hours as needed for wheezing.      No current facility-administered medications for this visit.     On ROS today: +oxygen via [place], Right arm swelling   Physical Examination   Vitals:   05/15/17 0828 05/15/17 0835  BP: (!) 142/93 137/88  Pulse: 66 66  Resp: 18   Temp: (!) 96.8 F (36 C)   TempSrc: Oral   SpO2: 94%   Weight: 160 lb (72.6 kg)   Height: 5\' 9"  (1.753 m)    Body mass index is 23.63 kg/m.  General Alert, O x 3, WD, Ill appearing, wheel chair bound, [place] oxygen on  Pulmonary Sym exp, good B air movt, rales on BUL, R chest veins prominent  Cardiac RRR, Nl S1, S2, no Murmurs, No rubs, No S3,S4  Vascular Vessel Right Left  Radial Not palpable Palpable  Brachial Palpable Palpable  Carotid Palpable, No Bruit Palpable, No Bruit  Aorta Not palpable N/A  Femoral Palpable Palpable  Popliteal Not palpable Not palpable  PT Not palpable Not palpable  AT Palpable Faintly palpable    Gastro- intestinal soft, non-distended, non-tender to palpation, No guarding or rebound, no HSM, no masses, no CVAT B, No palpable prominent aortic pulse,    Musculo- skeletal M/S 5/5 throughout, Extremities  without ischemic changes, R arm 1-2+ edema, palpable thrill and bruit  Neurologic Pain and light touch intact in extremities , Motor exam as listed above    Non-Invasive Vascular Imaging   EVAR Duplex (05/15/2017)  AAA sac size: 6.9 cm x 7.2 cm (Preop 8.1 cm) - size essentially unchanged from study on 09/28/16  No endoleak detected   Medical Decision Making   Kenneth NICOLOSI Sr. is a 83 y.o. male who presents s/p EVAR for asx large AAA, chronic kidney disease stage 3, likely central venous occlusion    Pt is asymptomatic with stable AAA sac size.  Would continue with annual surveillance for now.  I discussed with the patient the importance of surveillance of the endograft.  The next endograft duplex will be scheduled for 12 months.  In regards to his swollen  arm, this likely represents central venous occlusion vs stenosis.  Normally I would recommend R arm fistulogram and possible intervention but the patient is relatively asx and the contrast might induce CIN leading to ESRD.  I also noted that ligation of the R BC AVF might provide resolution of much of the swelling, but then he would have no ready access option   Pt is fine with observing for now.  Thank you for allowing [place] to participate in this patient's care.   [name], MD, FACS [facility] Office: [phone] Pager: [phone]

## 2017-05-15 ENCOUNTER — Encounter: Payer: Self-pay | Admitting: Vascular Surgery

## 2017-05-15 ENCOUNTER — Other Ambulatory Visit: Payer: Self-pay

## 2017-05-15 ENCOUNTER — Ambulatory Visit (HOSPITAL_COMMUNITY)
Admission: RE | Admit: 2017-05-15 | Discharge: 2017-05-15 | Disposition: A | Discharge status: Home / Self Care | Payer: Medicare Other | Source: Ambulatory Visit | Attending: Vascular Surgery | Admitting: Vascular Surgery

## 2017-05-15 ENCOUNTER — Ambulatory Visit (INDEPENDENT_AMBULATORY_CARE_PROVIDER_SITE_OTHER): Payer: Medicare Other | Admitting: Vascular Surgery

## 2017-05-15 VITALS — BP 137/88 | HR 66 | Temp 96.8°F | Resp 18 | Ht 69.0 in | Wt 160.0 lb

## 2017-05-15 DIAGNOSIS — I714 Abdominal aortic aneurysm, without rupture, unspecified: Secondary | ICD-10-CM

## 2017-05-22 DIAGNOSIS — M5030 Other cervical disc degeneration, unspecified cervical region: Secondary | ICD-10-CM | POA: Diagnosis not present

## 2017-05-22 DIAGNOSIS — I770 Arteriovenous fistula, acquired: Secondary | ICD-10-CM | POA: Diagnosis not present

## 2017-05-22 DIAGNOSIS — D631 Anemia in chronic kidney disease: Secondary | ICD-10-CM | POA: Diagnosis not present

## 2017-05-22 DIAGNOSIS — J8410 Pulmonary fibrosis, unspecified: Secondary | ICD-10-CM | POA: Diagnosis not present

## 2017-05-22 DIAGNOSIS — I129 Hypertensive chronic kidney disease with stage 1 through stage 4 chronic kidney disease, or unspecified chronic kidney disease: Secondary | ICD-10-CM | POA: Diagnosis not present

## 2017-05-22 DIAGNOSIS — M109 Gout, unspecified: Secondary | ICD-10-CM | POA: Diagnosis not present

## 2017-05-22 DIAGNOSIS — J302 Other seasonal allergic rhinitis: Secondary | ICD-10-CM | POA: Diagnosis not present

## 2017-05-22 DIAGNOSIS — N184 Chronic kidney disease, stage 4 (severe): Secondary | ICD-10-CM | POA: Diagnosis not present

## 2017-05-22 DIAGNOSIS — I2510 Atherosclerotic heart disease of native coronary artery without angina pectoris: Secondary | ICD-10-CM | POA: Diagnosis not present

## 2017-05-22 DIAGNOSIS — M1990 Unspecified osteoarthritis, unspecified site: Secondary | ICD-10-CM | POA: Diagnosis not present

## 2017-05-22 DIAGNOSIS — N2581 Secondary hyperparathyroidism of renal origin: Secondary | ICD-10-CM | POA: Diagnosis not present

## 2017-05-22 DIAGNOSIS — N2589 Other disorders resulting from impaired renal tubular function: Secondary | ICD-10-CM | POA: Diagnosis not present

## 2017-07-02 DIAGNOSIS — N2581 Secondary hyperparathyroidism of renal origin: Secondary | ICD-10-CM | POA: Diagnosis not present

## 2017-07-10 DIAGNOSIS — E785 Hyperlipidemia, unspecified: Secondary | ICD-10-CM | POA: Diagnosis not present

## 2017-07-10 DIAGNOSIS — J449 Chronic obstructive pulmonary disease, unspecified: Secondary | ICD-10-CM | POA: Diagnosis not present

## 2017-07-10 DIAGNOSIS — Z9889 Other specified postprocedural states: Secondary | ICD-10-CM | POA: Diagnosis not present

## 2017-07-10 DIAGNOSIS — I10 Essential (primary) hypertension: Secondary | ICD-10-CM | POA: Diagnosis not present

## 2017-07-10 DIAGNOSIS — G4734 Idiopathic sleep related nonobstructive alveolar hypoventilation: Secondary | ICD-10-CM | POA: Diagnosis not present

## 2017-08-12 DIAGNOSIS — I2510 Atherosclerotic heart disease of native coronary artery without angina pectoris: Secondary | ICD-10-CM | POA: Diagnosis not present

## 2017-08-12 DIAGNOSIS — M5030 Other cervical disc degeneration, unspecified cervical region: Secondary | ICD-10-CM | POA: Diagnosis not present

## 2017-08-12 DIAGNOSIS — N184 Chronic kidney disease, stage 4 (severe): Secondary | ICD-10-CM | POA: Diagnosis not present

## 2017-08-12 DIAGNOSIS — J302 Other seasonal allergic rhinitis: Secondary | ICD-10-CM | POA: Diagnosis not present

## 2017-08-12 DIAGNOSIS — I770 Arteriovenous fistula, acquired: Secondary | ICD-10-CM | POA: Diagnosis not present

## 2017-08-12 DIAGNOSIS — J8410 Pulmonary fibrosis, unspecified: Secondary | ICD-10-CM | POA: Diagnosis not present

## 2017-08-12 DIAGNOSIS — D631 Anemia in chronic kidney disease: Secondary | ICD-10-CM | POA: Diagnosis not present

## 2017-08-12 DIAGNOSIS — I129 Hypertensive chronic kidney disease with stage 1 through stage 4 chronic kidney disease, or unspecified chronic kidney disease: Secondary | ICD-10-CM | POA: Diagnosis not present

## 2017-08-12 DIAGNOSIS — N2581 Secondary hyperparathyroidism of renal origin: Secondary | ICD-10-CM | POA: Diagnosis not present

## 2017-08-12 DIAGNOSIS — M1990 Unspecified osteoarthritis, unspecified site: Secondary | ICD-10-CM | POA: Diagnosis not present

## 2017-08-12 DIAGNOSIS — N2589 Other disorders resulting from impaired renal tubular function: Secondary | ICD-10-CM | POA: Diagnosis not present

## 2017-08-12 DIAGNOSIS — M109 Gout, unspecified: Secondary | ICD-10-CM | POA: Diagnosis not present

## 2017-08-19 DIAGNOSIS — I129 Hypertensive chronic kidney disease with stage 1 through stage 4 chronic kidney disease, or unspecified chronic kidney disease: Secondary | ICD-10-CM | POA: Diagnosis not present

## 2017-10-31 DIAGNOSIS — M7989 Other specified soft tissue disorders: Secondary | ICD-10-CM | POA: Diagnosis not present

## 2017-10-31 DIAGNOSIS — I50810 Right heart failure, unspecified: Secondary | ICD-10-CM | POA: Diagnosis not present

## 2017-10-31 DIAGNOSIS — N184 Chronic kidney disease, stage 4 (severe): Secondary | ICD-10-CM | POA: Diagnosis not present

## 2017-10-31 DIAGNOSIS — E785 Hyperlipidemia, unspecified: Secondary | ICD-10-CM | POA: Diagnosis not present

## 2017-11-11 DIAGNOSIS — N2589 Other disorders resulting from impaired renal tubular function: Secondary | ICD-10-CM | POA: Diagnosis not present

## 2017-11-11 DIAGNOSIS — I272 Pulmonary hypertension, unspecified: Secondary | ICD-10-CM | POA: Diagnosis not present

## 2017-11-11 DIAGNOSIS — I770 Arteriovenous fistula, acquired: Secondary | ICD-10-CM | POA: Diagnosis not present

## 2017-11-11 DIAGNOSIS — I2510 Atherosclerotic heart disease of native coronary artery without angina pectoris: Secondary | ICD-10-CM | POA: Diagnosis not present

## 2017-11-11 DIAGNOSIS — M109 Gout, unspecified: Secondary | ICD-10-CM | POA: Diagnosis not present

## 2017-11-11 DIAGNOSIS — N2581 Secondary hyperparathyroidism of renal origin: Secondary | ICD-10-CM | POA: Diagnosis not present

## 2017-11-11 DIAGNOSIS — D631 Anemia in chronic kidney disease: Secondary | ICD-10-CM | POA: Diagnosis not present

## 2017-11-11 DIAGNOSIS — M5030 Other cervical disc degeneration, unspecified cervical region: Secondary | ICD-10-CM | POA: Diagnosis not present

## 2017-11-11 DIAGNOSIS — N184 Chronic kidney disease, stage 4 (severe): Secondary | ICD-10-CM | POA: Diagnosis not present

## 2017-11-11 DIAGNOSIS — J8410 Pulmonary fibrosis, unspecified: Secondary | ICD-10-CM | POA: Diagnosis not present

## 2017-11-11 DIAGNOSIS — I129 Hypertensive chronic kidney disease with stage 1 through stage 4 chronic kidney disease, or unspecified chronic kidney disease: Secondary | ICD-10-CM | POA: Diagnosis not present

## 2017-11-11 DIAGNOSIS — Z9889 Other specified postprocedural states: Secondary | ICD-10-CM | POA: Diagnosis not present

## 2018-11-06 DEATH — deceased
